# Patient Record
Sex: Female | Born: 1955
Health system: Southern US, Community
[De-identification: ages and names within clinical notes are randomized; demographics above are authoritative.]

## PROBLEM LIST (undated history)

## (undated) DIAGNOSIS — M1990 Unspecified osteoarthritis, unspecified site: Secondary | ICD-10-CM

## (undated) DIAGNOSIS — T7840XA Allergy, unspecified, initial encounter: Secondary | ICD-10-CM

## (undated) HISTORY — PX: TUBAL LIGATION: SHX77

## (undated) HISTORY — DX: Unspecified osteoarthritis, unspecified site: M19.90

## (undated) HISTORY — DX: Allergy, unspecified, initial encounter: T78.40XA

---

## 2001-01-25 ENCOUNTER — Other Ambulatory Visit: Admission: RE | Admit: 2001-01-25 | Discharge: 2001-01-25 | Payer: Self-pay | Admitting: Gynecology

## 2003-08-06 ENCOUNTER — Emergency Department (HOSPITAL_COMMUNITY): Admission: EM | Admit: 2003-08-06 | Discharge: 2003-08-06 | Payer: Self-pay | Admitting: Family Medicine

## 2004-07-01 ENCOUNTER — Ambulatory Visit: Payer: Self-pay | Admitting: Internal Medicine

## 2004-07-01 ENCOUNTER — Ambulatory Visit: Payer: Self-pay | Admitting: *Deleted

## 2004-09-17 ENCOUNTER — Ambulatory Visit: Payer: Self-pay | Admitting: Internal Medicine

## 2006-01-12 ENCOUNTER — Ambulatory Visit: Payer: Self-pay | Admitting: Family Medicine

## 2006-01-13 ENCOUNTER — Ambulatory Visit: Payer: Self-pay | Admitting: *Deleted

## 2006-09-08 ENCOUNTER — Ambulatory Visit: Payer: Self-pay | Admitting: Family Medicine

## 2006-11-25 ENCOUNTER — Encounter (INDEPENDENT_AMBULATORY_CARE_PROVIDER_SITE_OTHER): Payer: Self-pay | Admitting: *Deleted

## 2007-02-02 ENCOUNTER — Ambulatory Visit: Payer: Self-pay | Admitting: Family Medicine

## 2007-02-02 LAB — CONVERTED CEMR LAB
HCV Ab: NEGATIVE
Hep B E Ab: NEGATIVE
Hep B S Ab: NEGATIVE

## 2007-05-31 ENCOUNTER — Ambulatory Visit (HOSPITAL_COMMUNITY): Admission: RE | Admit: 2007-05-31 | Discharge: 2007-05-31 | Payer: Self-pay | Admitting: Family Medicine

## 2007-06-15 ENCOUNTER — Encounter (INDEPENDENT_AMBULATORY_CARE_PROVIDER_SITE_OTHER): Payer: Self-pay | Admitting: Family Medicine

## 2007-06-15 ENCOUNTER — Ambulatory Visit: Payer: Self-pay | Admitting: Family Medicine

## 2007-06-15 ENCOUNTER — Other Ambulatory Visit: Admission: RE | Admit: 2007-06-15 | Discharge: 2007-06-15 | Payer: Self-pay | Admitting: Family Medicine

## 2007-06-21 ENCOUNTER — Ambulatory Visit (HOSPITAL_COMMUNITY): Admission: RE | Admit: 2007-06-21 | Discharge: 2007-06-21 | Payer: Self-pay | Admitting: Family Medicine

## 2007-08-16 ENCOUNTER — Ambulatory Visit: Payer: Self-pay | Admitting: Family Medicine

## 2007-08-16 LAB — CONVERTED CEMR LAB
BUN: 19 mg/dL (ref 6–23)
CO2: 24 meq/L (ref 19–32)
Calcium: 9.2 mg/dL (ref 8.4–10.5)
Chloride: 105 meq/L (ref 96–112)
Cholesterol: 185 mg/dL (ref 0–200)
Creatinine, Ser: 0.66 mg/dL (ref 0.40–1.20)
HDL: 51 mg/dL (ref >39)
Magnesium: 2.1 mg/dL (ref 1.5–2.5)
Potassium: 4.5 meq/L (ref 3.5–5.3)
Sodium: 139 meq/L (ref 135–145)
Total CHOL/HDL Ratio: 3.6
VLDL: 18 mg/dL (ref 0–40)
Vit D, 1,25-Dihydroxy: 22 — ABNORMAL LOW (ref 30–89)

## 2008-02-20 ENCOUNTER — Ambulatory Visit: Payer: Self-pay | Admitting: Internal Medicine

## 2008-02-21 ENCOUNTER — Encounter: Payer: Self-pay | Admitting: Internal Medicine

## 2008-02-21 ENCOUNTER — Inpatient Hospital Stay (HOSPITAL_COMMUNITY): Admission: EM | Admit: 2008-02-21 | Discharge: 2008-02-22 | Payer: Self-pay | Admitting: Emergency Medicine

## 2008-03-01 ENCOUNTER — Ambulatory Visit: Payer: Self-pay | Admitting: Internal Medicine

## 2008-03-21 ENCOUNTER — Ambulatory Visit: Payer: Self-pay | Admitting: Family Medicine

## 2008-05-03 ENCOUNTER — Ambulatory Visit: Payer: Self-pay | Admitting: Family Medicine

## 2008-05-03 ENCOUNTER — Encounter (INDEPENDENT_AMBULATORY_CARE_PROVIDER_SITE_OTHER): Payer: Self-pay | Admitting: Adult Health

## 2008-05-03 LAB — CONVERTED CEMR LAB
AST: 22 units/L (ref 0–37)
Albumin: 4.1 g/dL (ref 3.5–5.2)
Basophils Absolute: 0 10*3/uL (ref 0.0–0.1)
Calcium: 9.1 mg/dL (ref 8.4–10.5)
Creatinine, Ser: 0.7 mg/dL (ref 0.40–1.20)
Eosinophils Absolute: 0.1 10*3/uL (ref 0.0–0.7)
Glucose, Bld: 95 mg/dL (ref 70–99)
HCT: 38.6 % (ref 36.0–46.0)
Hemoglobin: 13 g/dL (ref 12.0–15.0)
LDL Cholesterol: 109 mg/dL — ABNORMAL HIGH (ref 0–99)
LH: 41.7 milliintl units/mL
Monocytes Relative: 5 % (ref 3–12)
Platelets: 261 10*3/uL (ref 150–400)
Potassium: 4.3 meq/L (ref 3.5–5.3)
Rhuematoid fact SerPl-aCnc: 108 intl units/mL — ABNORMAL HIGH (ref 0–20)
Sodium: 143 meq/L (ref 135–145)
TSH: 1.1 microintl units/mL (ref 0.350–4.50)
Total Bilirubin: 0.4 mg/dL (ref 0.3–1.2)
Total CHOL/HDL Ratio: 3.7
Total Protein: 7.5 g/dL (ref 6.0–8.3)
Triglycerides: 125 mg/dL (ref <150)
VLDL: 25 mg/dL (ref 0–40)

## 2008-06-14 ENCOUNTER — Ambulatory Visit (HOSPITAL_COMMUNITY): Admission: RE | Admit: 2008-06-14 | Discharge: 2008-06-14 | Payer: Self-pay | Admitting: Internal Medicine

## 2008-06-27 ENCOUNTER — Ambulatory Visit: Payer: Self-pay | Admitting: Family Medicine

## 2008-08-14 ENCOUNTER — Ambulatory Visit: Payer: Self-pay | Admitting: Family Medicine

## 2008-09-26 ENCOUNTER — Ambulatory Visit: Payer: Self-pay | Admitting: Family Medicine

## 2008-10-30 ENCOUNTER — Ambulatory Visit: Payer: Self-pay | Admitting: Family Medicine

## 2008-10-30 LAB — CONVERTED CEMR LAB
ALT: 61 units/L — ABNORMAL HIGH (ref 0–35)
AST: 29 units/L (ref 0–37)
Albumin: 4 g/dL (ref 3.5–5.2)
Alkaline Phosphatase: 98 units/L (ref 39–117)
BUN: 14 mg/dL (ref 6–23)
Basophils Relative: 0 % (ref 0–1)
Calcium: 9.1 mg/dL (ref 8.4–10.5)
Creatinine, Ser: 0.76 mg/dL (ref 0.40–1.20)
Eosinophils Relative: 2 % (ref 0–5)
MCHC: 32.2 g/dL (ref 30.0–36.0)
Monocytes Relative: 5 % (ref 3–12)
Sodium: 141 meq/L (ref 135–145)
Total Protein: 7.5 g/dL (ref 6.0–8.3)
WBC: 5.3 10*3/uL (ref 4.0–10.5)

## 2008-12-05 ENCOUNTER — Ambulatory Visit: Payer: Self-pay | Admitting: Family Medicine

## 2008-12-05 LAB — CONVERTED CEMR LAB
Indirect Bilirubin: 0.4 mg/dL (ref 0.0–0.9)
Total Bilirubin: 0.5 mg/dL (ref 0.3–1.2)
Total Protein: 7 g/dL (ref 6.0–8.3)

## 2008-12-26 ENCOUNTER — Ambulatory Visit: Payer: Self-pay | Admitting: Family Medicine

## 2009-03-26 ENCOUNTER — Ambulatory Visit: Payer: Self-pay | Admitting: Family Medicine

## 2009-03-26 LAB — CONVERTED CEMR LAB
ALT: 21 units/L (ref 0–35)
Albumin: 4.1 g/dL (ref 3.5–5.2)
Alkaline Phosphatase: 96 units/L (ref 39–117)
BUN: 21 mg/dL (ref 6–23)
CO2: 26 meq/L (ref 19–32)
Calcium: 9 mg/dL (ref 8.4–10.5)
Creatinine, Ser: 0.69 mg/dL (ref 0.40–1.20)
Eosinophils Absolute: 0.1 10*3/uL (ref 0.0–0.7)
Glucose, Bld: 90 mg/dL (ref 70–99)
Lymphocytes Relative: 35 % (ref 12–46)
RBC: 4.33 M/uL (ref 3.87–5.11)
RDW: 13.2 % (ref 11.5–15.5)
Rhuematoid fact SerPl-aCnc: 99 intl units/mL — ABNORMAL HIGH (ref 0–20)
Sodium: 140 meq/L (ref 135–145)
Total Protein: 7.4 g/dL (ref 6.0–8.3)
WBC: 5.7 10*3/uL (ref 4.0–10.5)

## 2009-05-01 ENCOUNTER — Ambulatory Visit: Payer: Self-pay | Admitting: Family Medicine

## 2009-06-26 ENCOUNTER — Ambulatory Visit: Payer: Self-pay | Admitting: Family Medicine

## 2009-06-26 LAB — CONVERTED CEMR LAB
AST: 22 units/L (ref 0–37)
Alkaline Phosphatase: 91 units/L (ref 39–117)
Basophils Absolute: 0 10*3/uL (ref 0.0–0.1)
Basophils Relative: 0 % (ref 0–1)
CO2: 26 meq/L (ref 19–32)
Calcium: 9.3 mg/dL (ref 8.4–10.5)
Chloride: 104 meq/L (ref 96–112)
Glucose, Bld: 88 mg/dL (ref 70–99)
HCT: 40.7 % (ref 36.0–46.0)
Lymphs Abs: 1.8 10*3/uL (ref 0.7–4.0)
Monocytes Relative: 7 % (ref 3–12)
Neutrophils Relative %: 50 % (ref 43–77)
Potassium: 4.4 meq/L (ref 3.5–5.3)
RBC: 4.36 M/uL (ref 3.87–5.11)
Rhuematoid fact SerPl-aCnc: 85 intl units/mL — ABNORMAL HIGH (ref 0–20)

## 2009-07-03 ENCOUNTER — Ambulatory Visit (HOSPITAL_COMMUNITY): Admission: RE | Admit: 2009-07-03 | Discharge: 2009-07-03 | Payer: Self-pay | Admitting: Internal Medicine

## 2009-07-16 ENCOUNTER — Emergency Department (HOSPITAL_COMMUNITY): Admission: EM | Admit: 2009-07-16 | Discharge: 2009-07-16 | Payer: Self-pay | Admitting: Emergency Medicine

## 2009-07-31 ENCOUNTER — Ambulatory Visit: Payer: Self-pay | Admitting: Family Medicine

## 2009-10-01 ENCOUNTER — Ambulatory Visit: Payer: Self-pay | Admitting: Family Medicine

## 2009-10-01 LAB — CONVERTED CEMR LAB
CO2: 27 meq/L (ref 19–32)
Calcium: 9.1 mg/dL (ref 8.4–10.5)
Creatinine, Ser: 0.72 mg/dL (ref 0.40–1.20)
Eosinophils Absolute: 0.2 10*3/uL (ref 0.0–0.7)
Eosinophils Relative: 4 % (ref 0–5)
Glucose, Bld: 90 mg/dL (ref 70–99)
HCT: 40.1 % (ref 36.0–46.0)
Lymphocytes Relative: 39 % (ref 12–46)
Lymphs Abs: 2 10*3/uL (ref 0.7–4.0)
MCHC: 32.7 g/dL (ref 30.0–36.0)
Monocytes Absolute: 0.3 10*3/uL (ref 0.1–1.0)
Monocytes Relative: 6 % (ref 3–12)
Neutro Abs: 2.7 10*3/uL (ref 1.7–7.7)
RDW: 13.4 % (ref 11.5–15.5)
Sodium: 140 meq/L (ref 135–145)
Total Bilirubin: 0.4 mg/dL (ref 0.3–1.2)

## 2009-11-06 ENCOUNTER — Ambulatory Visit: Payer: Self-pay | Admitting: Family Medicine

## 2010-01-22 ENCOUNTER — Encounter (INDEPENDENT_AMBULATORY_CARE_PROVIDER_SITE_OTHER): Payer: Self-pay | Admitting: Family Medicine

## 2010-01-22 ENCOUNTER — Ambulatory Visit: Payer: Self-pay | Admitting: Family Medicine

## 2010-01-22 LAB — CONVERTED CEMR LAB
ALT: 31 U/L
AST: 21 U/L
Albumin: 4.2 g/dL
Alkaline Phosphatase: 92 U/L
BUN: 14 mg/dL
Basophils Absolute: 0 K/uL
Basophils Relative: 1 %
CO2: 29 meq/L
Calcium: 9.6 mg/dL
Chloride: 104 meq/L
Creatinine, Ser: 0.51 mg/dL
Eosinophils Absolute: 0.2 K/uL
Eosinophils Relative: 3 %
Glucose, Bld: 89 mg/dL
HCT: 40.3 %
Hemoglobin: 13.1 g/dL
Lymphocytes Relative: 41 %
Lymphs Abs: 2.3 K/uL
MCHC: 32.5 g/dL
MCV: 92.9 fL
Monocytes Absolute: 0.4 K/uL
Monocytes Relative: 7 %
Neutro Abs: 2.8 K/uL
Neutrophils Relative %: 49 %
Platelets: 354 K/uL
Potassium: 4.7 meq/L
RBC: 4.34 M/uL
RDW: 14 %
Rheumatoid fact SerPl-aCnc: 83 [IU]/mL — ABNORMAL HIGH
Sodium: 141 meq/L
Total Bilirubin: 0.4 mg/dL
Total Protein: 7.8 g/dL
WBC: 5.6 10*3/microliter

## 2010-07-23 NOTE — Letter (Signed)
March 01, 2008    Marca Ancona, MD  [address]  [place] [address]   RE:  [name], [name]  MRN:  [id number]  /  DOB:  [birth year]   Dear Freida Busman,   It was a pleasure seeing Veronica Hart at your request regarding  her syncopal episode.   The patient is a 55 year old Hispanic woman who was admitted to hospital  following a syncopal episode.  She had gone to the bathroom, she had  urinated, and then fell off the commode.  She was able to get herself  back on the commode after some number of minutes.  When seen by her son,  she was described as pale.  She denies warmth, diaphoresis, or flushing.  She was able at some point over the next number of minutes to get  herself up into the sofa in the family room.  Her son took her blood  pressure at some point in this series of events; it was about 150.  He  gave her ramipril, and some minutes later, she had passed out again.   She was taken to the hospital and she apparently experienced syncope.  There telemetry showed her heart rate was sinus in the 30s.  I presumed  that the blood pressure was low, although those data are not available.  I have reviewed the dictated notes in e-chart and they are not available  there either.  I should also note though that the emergency room doctor  described nausea and diaphoresis as preceded in the first episode.   She has a history of lightheadedness upon standing and bending.  This  has been somewhat longstanding.   She was put on a blood pressure medication at discharge and this has  been associated with some lightheadedness and she stopped it.  Currently, she is taking no medications at all.  She has no known drug  allergies.   SOCIAL HISTORY:  She is married.  She has 2 children.  She does not use  cigarettes, alcohol, or recreational drugs.  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   REVIEW OF SYSTEMS:  Notable for arthritis in her knee and was otherwise  broadly  negative.  She had no prior surgery.   On examination, her blood pressure 127/81 with a pulse of 81 without  orthostatic change.  Her weight was 139.  She was in no acute distress.  Her HEENT exam demonstrated no icterus or xanthoma.  The neck veins were  flat.  The carotids were brisk and full bilaterally without bruits.  Back was without kyphosis or scoliosis.  Lungs were clear.  Heart sounds  were regular without murmurs or gallops.  The abdomen was soft with  active bowel sounds without midline pulsation or hepatomegaly.  Femoral  pulses were 2+.  Distal pulses were intact.  There is no clubbing,  cyanosis, or edema.  Neurological exam was grossly normal.   Electrocardiogram obtained at the Emergency Center was normal.  Echocardiogram was reported as normal.   IMPRESSION:  1. Likely orthostatic syncope with some history of orthostatic      hypotension.  2. Borderline blood pressure issues with poor tolerance of her      amlodipine.   [name] has likely neurally-mediated syncope potentially  provoked by micturition.  I have advised her just to be aware of the  prodrome and to try to put herself flat in the event that she recognizes  it is coming  on.   We reviewed the data that life-threatening syncope is very small given  her structurally normal heart and normal electrocardiogram.  I was  reassuring.  I asked her if she would like to follow up with [place] and she  said no.  She follows with [facility] and will call if there are  further issues.   Thanks very much for the consultation.    Sincerely,      Duke Salvia, MD, [facility]  Electronically Signed    SCK/MedQ  DD: 03/01/2008  DT: 03/02/2008  Job #: [id number]   CC:    Melvern Banker

## 2010-07-23 NOTE — H&P (Signed)
NAME:  Veronica Hart, Veronica Hart NO.:  [id number]   MEDICAL RECORD NO.:  [id number]          PATIENT TYPE:  EMS   LOCATION:  MAJO                         FACILITY:  [facility]   PHYSICIAN:  Wendi Snipes, MD DATE OF BIRTH:  11-17-1955   DATE OF ADMISSION:  02/21/2008  DATE OF DISCHARGE:                              HISTORY & PHYSICAL   PRIMARY CARE PHYSICIAN:  [facility] here in [place].   CHIEF COMPLAINT:  Syncope.   HISTORY OF PRESENT ILLNESS:  This is a 55 year old Spanish-speaking  female here after passing out while in the restroom.  This has never  happened before, and she states that she has been feeling fine and  spontaneously felt weak and felt a darkness come over her and passed  out.  This occurred an additional time while standing, and she fell and  hit her head.  She otherwise denies chest pain, palpitations, nausea,  abdominal pain fevers increased lower extremity edema, paroxysmal  nocturnal dyspnea.  In the ED, she experienced an episode of syncope  while in the stretcher and telemetry showed her heart rate was sinus and  in the 30s at the time.  This resolved spontaneously and lasted for  moments.   PAST MEDICAL HISTORY:  Hypertension.   ALLERGIES:  NO KNOWN DRUG ALLERGIES.   MEDICATIONS:  Altace 5 mg daily.   SOCIAL HISTORY:  She lives in [place] with her husband and her son.  She works at [employer].  She does not smoke.   FAMILY HISTORY:  Noncontributory.  No early coronary disease   REVIEW OF SYSTEMS:  All 14-systems were reviewed were negative except as  mentioned in detail in the HPI.   PHYSICAL EXAMINATION:  VITAL SIGNS:  Blood pressure is 126/57,  respiratory rate 16, pulse 73.  She is sating 100% on 2 liters nasal  cannula.  GENERAL:  She is a 55 year old Latin female appearing stated age.  No  acute distress.  HEENT:  Moist mucous membranes.  Pupils equal, round, react to light  accommodation.  Anicteric sclera.  NECK:  No  jugular venous distention.  No thyromegaly.  CARDIOVASCULAR:  Regular light and rhythm.  No murmurs, rubs or gallops.  LUNGS:  Clear to auscultation bilaterally.  ABDOMEN:  Nontender, nondistended.  Positive bowel sounds.  No masses.  EXTREMITIES:  No clubbing, cyanosis, edema, 2+ pulses throughout.  NEUROLOGICAL:  Alert and oriented x3.  Cranial nerves II-XII grossly  intact.  No focal neurologic deficits.  SKIN:  Warm, dry and intact.  No rashes.  Chest x-ray showed vascular  congestion with atelectasis.  Mild EKG.  Normal sinus rhythm at 66 beats  per minute with early precordial transition.  No ST or T-wave  abnormalities.   LABORATORY REVIEW:  White blood cell count 8.1, hematocrit is 37,  platelets 264.  BUN and Creatinine is 25 and 0.9, respectfully.  Initial  troponin was negative.  D-dimer 0.22.   ASSESSMENT:  A 55 year old Spanish-speaking female here with syncope and  witnessed syncopal event associated with brady arrhythmia.   Syncope/bradycardia.  This is very likely neurocardiogenic in etiology.  Will check a transthoracic  echocardiogram and TSH urinalysis, BNP and  additionally orthostatic blood pressures.  onsider a tilt table test.  She may not benefit from permanent pacemaker if she lacks conduction  system disease, structural heart disease or recurrent symptoms.  Will  continue to monitor her on telemetry and she may need further outpatient  monitoring after discharge seeing this patient.      Wendi Snipes, MD  Electronically Signed     BHH/MEDQ  D:  02/21/2008  T:  02/21/2008  Job:  [id number]

## 2010-07-23 NOTE — Discharge Summary (Signed)
Veronica Hart, Veronica Hart         ACCOUNT NO.:  [id number]   MEDICAL RECORD NO.:  [id number]          PATIENT TYPE:  INP   LOCATION:  3730                         FACILITY:  [facility]   PHYSICIAN:  Rollene Rotunda, MD, FACCDATE OF BIRTH:  06-01-1955   DATE OF ADMISSION:  02/21/2008  DATE OF DISCHARGE:  02/22/2008                               DISCHARGE SUMMARY   PRIMARY CARDIOLOGIST:  The patient does not have a primary cardiologist  and primary MD as listed in health service.   DISCHARGE DIAGNOSIS:  Neurocardiogenic syncope.   SECONDARY DIAGNOSIS:  Hypertension.   ALLERGIES:  The patient has no known drug allergies.   PROCEDURES PERFORMED DURING THIS HOSPITALIZATION:  1. On February 21, 2008, the patient had a chest x-ray performed,      which showed low volume chest film with vascular crowding and      atelectasis.  It showed mild central vascular congestion without      overt pulmonary edema.  2. On February 21, 2008, the patient had a electrocardiogram      performed.  It showed normal sinus rhythm with a rate of 72.  No      significant ST changes.  No significant Q waves.  Normal axis.  3. On February 22, 2008, the patient had an electrocardiogram with no      significant changes from the previous one performed on February 21, 2008.  4. On February 21, 2008, the patient had an echocardiogram that showed      LV systolic function was vigorous with an LVEF between 65 and 75%.   BRIEF HISTORY OF PRESENT ILLNESS:  The patient is a 55 year old Spanish-  speaking female with a history of hypertension who presents after  passing out while in the restroom.  This occurred one additional time  while standing.  She fell and hit her head.  This has never happened  before.  She describes lightheadedness and darkness prior to loss of  consciousness. She passed out while in the ED with tele showing a heart  rate in the 30s.   HOSPITAL COURSE:  The patient was admitted and ruled out  for MI, and for  significant anatomical abnormalities on echo.  The patient will be  followed up on an outpatient basis with a one month event monitor as  well as tilt table test.  Prior to discharge, the patient ambulated  without any symptoms.  Vital signs remained stable during ambulation.  Vital signs on the day of discharge were temperature 97.6, BP 107/59,  heart rate 73, respirations 19, and O2 saturations 97% on room air.  Her  weight was 62.8 kg.   DISCHARGE LABORATORY DATA:  WBC 8.7, HGB 12.9, HCT 37.8, and PLT count  238.  Sodium 140, potassium 4.0, chloride 107, CO2 26, BUN 17,  creatinine 0.72, glucose 98, and calcium 9.0.  Last set of cardiac  markers negative.  CK 70, CK-MB 1.0, and troponin I less than 0.01.  Urinalysis color yellow, appearance clear, SG 1.014, and pH 7.5.  Urine  glucose negative, bilirubin negative, ketones negative, blood negative,  protein  negative, urobilinogen 0.2, nitrite negative, leukocyte trace,  squamous epithelial cells rare, wbc/HPF 0-2, rbc/HPF 0-2, and bacteria  rare.  No outstanding labs at this time.   DISCHARGE MEDICATION:  Ramipril 5 mg by mouth daily.   Duration of discharge encountered including physician time was 45  minutes.      Jarrett Ables, [facility]      Rollene Rotunda, MD, [facility]  Electronically Signed    MS/MEDQ  D:  02/22/2008  T:  02/23/2008  Job:  [id number]   cc:   Dr. Shirlee Latch

## 2010-07-25 ENCOUNTER — Other Ambulatory Visit (HOSPITAL_COMMUNITY): Payer: Self-pay | Admitting: Family Medicine

## 2010-07-25 DIAGNOSIS — Z1231 Encounter for screening mammogram for malignant neoplasm of breast: Secondary | ICD-10-CM

## 2010-07-29 ENCOUNTER — Ambulatory Visit (HOSPITAL_COMMUNITY)
Admission: RE | Admit: 2010-07-29 | Discharge: 2010-07-29 | Disposition: A | Discharge status: Home / Self Care | Payer: Self-pay | Source: Ambulatory Visit | Attending: Family Medicine | Admitting: Family Medicine

## 2010-07-29 DIAGNOSIS — Z1231 Encounter for screening mammogram for malignant neoplasm of breast: Secondary | ICD-10-CM

## 2010-12-13 LAB — MAGNESIUM: Magnesium: 2.3 mg/dL (ref 1.5–2.5)

## 2010-12-13 LAB — COMPREHENSIVE METABOLIC PANEL
AST: 20 U/L (ref 0–37)
Albumin: 3.5 g/dL (ref 3.5–5.2)
BUN: 19 mg/dL (ref 6–23)
CO2: 24 mEq/L (ref 19–32)
Creatinine, Ser: 0.63 mg/dL (ref 0.4–1.2)
GFR calc Af Amer: >60 mL/min (ref >60)
Glucose, Bld: 115 mg/dL — ABNORMAL HIGH (ref 70–99)
Sodium: 136 mEq/L (ref 135–145)

## 2010-12-13 LAB — D-DIMER, QUANTITATIVE: D-Dimer, Quant: 0.22 ug/mL-FEU (ref 0.00–0.48)

## 2010-12-13 LAB — CBC
HCT: 37.8 % (ref 36.0–46.0)
Hemoglobin: 12.5 g/dL (ref 12.0–15.0)
MCHC: 35.1 g/dL (ref 30.0–36.0)
MCV: 91.4 fL (ref 78.0–100.0)
Platelets: 238 10*3/uL (ref 150–400)
Platelets: 264 10*3/uL (ref 150–400)
RBC: 4.05 MIL/uL (ref 3.87–5.11)
WBC: 8.7 10*3/uL (ref 4.0–10.5)

## 2010-12-13 LAB — BASIC METABOLIC PANEL
BUN: 17 mg/dL (ref 6–23)
Chloride: 107 mEq/L (ref 96–112)
GFR calc non Af Amer: >60 mL/min (ref >60)
Sodium: 140 mEq/L (ref 135–145)

## 2010-12-13 LAB — HEMOGLOBIN A1C: Hgb A1c MFr Bld: 6 % (ref 4.6–6.1)

## 2010-12-13 LAB — POCT I-STAT, CHEM 8
BUN: 23 mg/dL (ref 6–23)
Calcium, Ion: 1.16 mmol/L (ref 1.12–1.32)
Glucose, Bld: 132 mg/dL — ABNORMAL HIGH (ref 70–99)

## 2010-12-13 LAB — CARDIAC PANEL(CRET KIN+CKTOT+MB+TROPI)
CK, MB: 1 ng/mL (ref 0.3–4.0)
Relative Index: INVALID (ref 0.0–2.5)
Troponin I: <0.01 ng/mL (ref 0.00–0.06)

## 2010-12-13 LAB — DIFFERENTIAL
Basophils Relative: 0 % (ref 0–1)
Lymphocytes Relative: 19 % (ref 12–46)
Lymphs Abs: 1.5 10*3/uL (ref 0.7–4.0)
Monocytes Relative: 4 % (ref 3–12)
Neutrophils Relative %: 76 % (ref 43–77)

## 2010-12-13 LAB — URINALYSIS, ROUTINE W REFLEX MICROSCOPIC
Bilirubin Urine: NEGATIVE
Hgb urine dipstick: NEGATIVE
Ketones, ur: NEGATIVE mg/dL
Nitrite: NEGATIVE
Urobilinogen, UA: 0.2 mg/dL (ref 0.0–1.0)

## 2010-12-13 LAB — POCT CARDIAC MARKERS
CKMB, poc: <1 ng/mL — ABNORMAL LOW (ref 1.0–8.0)
Myoglobin, poc: 25 ng/mL (ref 12–200)
Troponin i, poc: <0.05 ng/mL (ref 0.00–0.09)

## 2010-12-13 LAB — B-NATRIURETIC PEPTIDE (CONVERTED LAB): Pro B Natriuretic peptide (BNP): <30 pg/mL (ref 0.0–100.0)

## 2010-12-13 LAB — CREATININE, SERUM: GFR calc Af Amer: >60 mL/min (ref >60)

## 2010-12-13 LAB — TSH: TSH: 0.65 u[IU]/mL (ref 0.350–4.500)

## 2010-12-13 LAB — APTT: aPTT: 24 seconds (ref 24–37)

## 2010-12-13 LAB — PROTIME-INR
INR: 0.9 (ref 0.00–1.49)
Prothrombin Time: 12.4 seconds (ref 11.6–15.2)

## 2010-12-13 LAB — URINE MICROSCOPIC-ADD ON

## 2010-12-13 LAB — GLUCOSE, CAPILLARY: Glucose-Capillary: 140 mg/dL — ABNORMAL HIGH (ref 70–99)

## 2012-12-07 ENCOUNTER — Ambulatory Visit: Payer: Self-pay | Attending: Internal Medicine

## 2013-01-11 ENCOUNTER — Ambulatory Visit: Payer: Self-pay | Attending: Internal Medicine | Admitting: Internal Medicine

## 2013-01-11 VITALS — BP 140/84 | HR 76 | Temp 98.0°F | Resp 16 | Wt 138.4 lb

## 2013-01-11 DIAGNOSIS — H9202 Otalgia, left ear: Secondary | ICD-10-CM

## 2013-01-11 DIAGNOSIS — M1990 Unspecified osteoarthritis, unspecified site: Secondary | ICD-10-CM

## 2013-01-11 DIAGNOSIS — R51 Headache: Secondary | ICD-10-CM

## 2013-01-11 MED ORDER — IBUPROFEN 400 MG PO TABS: 800.0000 mg | ORAL_TABLET | Freq: Three times a day (TID) | ORAL | Status: DC | PRN

## 2013-01-11 NOTE — Progress Notes (Unsigned)
Patient Demographics  Veronica Hart, is a 57 y.o. female  JYN:[id number]  QMV:[id number]  DOB - April 28, 1955  Chief Complaint  Patient presents with  . Headache  . Sore Throat        Subjective:   Veronica Hart today is here to establish primary care. Patient has no significant past medical history presents here for establishment of routine primary care. She claims that for the past 3 months she has been having intermittent left ear pain. She also claims that her left throat hurts intermittently as well. She denies any hoarseness of voice, cough, difficulty swallowing. She denies any cough or fever as well. She also gives a history of having bilateral knee pain. She also gives a history of having intermittent frontal headaches-especially when she reads, is not wearing her glasses-not sure if these are prescription glasses. No history of phonophobia or photophobia.  Currently patient has no other complaints. Patient has also has No headache, No chest pain, No abdominal pain,No Nausea, No new weakness tingling or numbness, No Cough or SOB.   Objective:    Filed Vitals:   01/11/13 1634  BP: 140/84  Pulse: 76  Temp: 98 F (36.7 C)  Resp: 16  Weight: 138 lb 6.4 oz (62.778 kg)  SpO2: 100%     ALLERGIES:  No Known Allergies  PAST MEDICAL HISTORY: History reviewed. No pertinent past medical history.  PAST SURGICAL HISTORY: Bilateral tubal ligation  FAMILY HISTORY: No family history of coronary disease  MEDICATIONS AT HOME: Prior to Admission medications   Medication Sig Start Date End Date Taking? Authorizing Provider  LORATADINE PO Take by mouth.   Yes Historical Provider, MD  ibuprofen (ADVIL,MOTRIN) 400 MG tablet Take 2 tablets (800 mg total) by mouth every 8 (eight) hours as needed for mild pain. 01/11/13   Maretta Bees, MD    SOCIAL HISTORY:   has no tobacco, alcohol, and drug history on file.  REVIEW OF SYSTEMS:  Constitutional:   No    Fevers, chills, fatigue.  HEENT:    No , Sore throat,   Cardio-vascular: No chest pain,  Orthopnea, swelling in lower extremities, anasarca, palpitations  GI:  No abdominal pain, nausea, vomiting, diarrhea  Resp: No shortness of breath,  No coughing up of blood.No cough.No wheezing.  Skin:  no rash or lesions.  GU:  no dysuria, change in color of urine, no urgency or frequency.  No flank pain.  Musculoskeletal: No joint pain or swelling.  No decreased range of motion.  No back pain.  Psych: No change in mood or affect. No depression or anxiety.  No memory loss.   Exam  General appearance :Awake, alert, not in any distress. Speech Clear. Not toxic Looking HEENT: Atraumatic and Normocephalic, pupils equally reactive to light and accomodation. Examination of the left ear auditory canal shows some whitish deposits in the tympanic membrane Neck: supple, no JVD. No cervical lymphadenopathy.  Chest:Good air entry bilaterally, no added sounds  CVS: S1 S2 regular, no murmurs.  Abdomen: Bowel sounds present, Non tender and not distended with no gaurding, rigidity or rebound. Extremities: B/L Lower Ext shows no edema, both legs are warm to touch Neurology: Awake alert, and oriented X 3, CN II-XII intact, Non focal Skin:No Rash Wounds:N/A    Data Review   CBC No results found for this basename: WBC, HGB, HCT, PLT, MCV, MCH, MCHC, RDW, NEUTRABS, LYMPHSABS, MONOABS, EOSABS, BASOSABS, BANDABS, BANDSABD,  in the last 168 hours  Chemistries   No results  found for this basename: NA, K, CL, CO2, GLUCOSE, BUN, CREATININE, GFRCGP, CALCIUM, MG, AST, ALT, ALKPHOS, BILITOT,  in the last 168 hours ------------------------------------------------------------------------------------------------------------------ No results found for this basename: HGBA1C,  in the last 72  hours ------------------------------------------------------------------------------------------------------------------ No results found for this basename: CHOL, HDL, LDLCALC, TRIG, CHOLHDL, LDLDIRECT,  in the last 72 hours ------------------------------------------------------------------------------------------------------------------ No results found for this basename: TSH, T4TOTAL, FREET3, T3FREE, THYROIDAB,  in the last 72 hours ------------------------------------------------------------------------------------------------------------------ No results found for this basename: VITAMINB12, FOLATE, FERRITIN, TIBC, IRON, RETICCTPCT,  in the last 72 hours  Coagulation profile  No results found for this basename: INR, PROTIME,  in the last 168 hours    Assessment & Plan   Left ear pain - Intermittent, some whitish deposits noted tympanic membrane? Significance - Will refer to ENT  Intermittent headache - This is a chronic history for the patient - Mostly in the frontal area. No phonophobia photophobia. Trial of nonsteroidal anti-inflammatory medications. If persists, will consider further workup. For now monitor and follow clinical course  Bilateral knee pain - Spread osteoarthritis - Check x-ray of the knees  We'll check some routine baseline labs including CBC, CMET, TSH, A1c- please follow at next visit   Health Maintenance -Colonoscopy: Will refer to GI -Pap Smear: Will refer to GYN -Mammogram: Will order -Vaccinations:  - Influenza-will order today  Follow up in one month  The patient was given clear instructions to go to ER or return to medical center if symptoms don't improve, worsen or new problems develop. The patient verbalized understanding. The patient was told to call to get lab results if they haven't heard anything in the next week.

## 2013-01-11 NOTE — Progress Notes (Unsigned)
Patient here with interpreter Complains of sore throat and headache past two weeks Has arthritis and having pain to her knees and hands

## 2013-01-12 ENCOUNTER — Other Ambulatory Visit: Payer: Self-pay | Admitting: Internal Medicine

## 2013-01-12 DIAGNOSIS — Z1231 Encounter for screening mammogram for malignant neoplasm of breast: Secondary | ICD-10-CM

## 2013-01-17 ENCOUNTER — Ambulatory Visit: Payer: No Typology Code available for payment source | Attending: Internal Medicine

## 2013-01-17 DIAGNOSIS — H9202 Otalgia, left ear: Secondary | ICD-10-CM

## 2013-01-17 DIAGNOSIS — H9209 Otalgia, unspecified ear: Secondary | ICD-10-CM | POA: Insufficient documentation

## 2013-01-17 LAB — CBC
HCT: 37.5 % (ref 36.0–46.0)
Hemoglobin: 12.7 g/dL (ref 12.0–15.0)
MCHC: 33.9 g/dL (ref 30.0–36.0)
WBC: 4.7 10*3/uL (ref 4.0–10.5)

## 2013-01-18 LAB — COMPLETE METABOLIC PANEL WITH GFR
CO2: 27 mEq/L (ref 19–32)
Creat: 0.67 mg/dL (ref 0.50–1.10)
GFR, Est African American: >89 mL/min
GFR, Est Non African American: >89 mL/min
Glucose, Bld: 90 mg/dL (ref 70–99)
Total Bilirubin: 0.3 mg/dL (ref 0.3–1.2)

## 2013-01-18 LAB — LIPID PANEL
HDL: 44 mg/dL (ref >39)
LDL Cholesterol: 103 mg/dL — ABNORMAL HIGH (ref 0–99)
Triglycerides: 100 mg/dL (ref <150)
VLDL: 20 mg/dL (ref 0–40)

## 2013-01-19 ENCOUNTER — Encounter: Payer: Self-pay | Admitting: Nurse Practitioner

## 2013-01-19 ENCOUNTER — Telehealth: Payer: Self-pay | Admitting: Emergency Medicine

## 2013-01-19 NOTE — Telephone Encounter (Signed)
Message copied by Darlis Loan on Wed Jan 19, 2013  1:40 PM ------      Message from: Maretta Bees      Created: Wed Jan 19, 2013 11:24 AM       Please let patient know her A1C is slightly high-to begin diet and exercise, she will need to keep her next appointment and will discuss treatment options then. ------

## 2013-01-19 NOTE — Progress Notes (Signed)
Pt given lab results with instructions via pacific interpretor phone. Verbalized understanding.

## 2013-01-19 NOTE — Progress Notes (Signed)
Quick Note:  Please let patient know her A1C is slightly high-to begin diet and exercise, she will need to keep her next appointment and will discuss treatment options then. ______

## 2013-02-14 ENCOUNTER — Other Ambulatory Visit: Payer: Self-pay | Admitting: Internal Medicine

## 2013-02-14 ENCOUNTER — Ambulatory Visit: Payer: No Typology Code available for payment source | Attending: Internal Medicine | Admitting: Internal Medicine

## 2013-02-14 ENCOUNTER — Encounter: Payer: Self-pay | Admitting: Internal Medicine

## 2013-02-14 ENCOUNTER — Ambulatory Visit (HOSPITAL_COMMUNITY)
Admission: RE | Admit: 2013-02-14 | Discharge: 2013-02-14 | Disposition: A | Discharge status: Home / Self Care | Payer: No Typology Code available for payment source | Source: Ambulatory Visit | Attending: Internal Medicine | Admitting: Internal Medicine

## 2013-02-14 VITALS — BP 156/91 | HR 71 | Temp 98.2°F | Resp 16 | Ht <= 58 in | Wt 139.0 lb

## 2013-02-14 DIAGNOSIS — M1990 Unspecified osteoarthritis, unspecified site: Secondary | ICD-10-CM | POA: Insufficient documentation

## 2013-02-14 DIAGNOSIS — Z1231 Encounter for screening mammogram for malignant neoplasm of breast: Secondary | ICD-10-CM

## 2013-02-14 DIAGNOSIS — H9202 Otalgia, left ear: Secondary | ICD-10-CM

## 2013-02-14 DIAGNOSIS — M129 Arthropathy, unspecified: Secondary | ICD-10-CM

## 2013-02-14 MED ORDER — NAPROXEN 500 MG PO TABS: 500.0000 mg | ORAL_TABLET | Freq: Two times a day (BID) | ORAL | Status: DC

## 2013-02-14 NOTE — Progress Notes (Signed)
Patient ID: Veronica Hart, female   DOB: 31-Jul-1955, 57 y.o.   MRN: [id number] Patient Demographics  Veronica Hart, is a 57 y.o. female  WUJ:[id number]  [id number]  DOB - 07-Jun-1955  Chief Complaint  Patient presents with  . Follow-up        Subjective:   Veronica Hart is a 57 y.o. female here today for a follow up visit. Patient is here mainly to discuss the results of the previous laboratory tests. She also wants a pain medication For her osteoarthritis. She has no new complaints. She does not smoke cigarette, she does not drink alcohol Patient has No headache, No chest pain, No abdominal pain - No Nausea, No new weakness tingling or numbness, No Cough - SOB.  ALLERGIES: No Known Allergies  PAST MEDICAL HISTORY: History reviewed. No pertinent past medical history.  MEDICATIONS AT HOME: Prior to Admission medications   Medication Sig Start Date End Date Taking? Authorizing Provider  ibuprofen (ADVIL,MOTRIN) 400 MG tablet Take 2 tablets (800 mg total) by mouth every 8 (eight) hours as needed for mild pain. 01/11/13   Shanker Levora Dredge, MD  LORATADINE PO Take by mouth.    Historical Provider, MD  naproxen (NAPROSYN) 500 MG tablet Take 1 tablet (500 mg total) by mouth 2 (two) times daily with a meal. 02/14/13   Jeanann Lewandowsky, MD     Objective:   Filed Vitals:   02/14/13 1206  BP: 156/91  Pulse: 71  Temp: 98.2 F (36.8 C)  TempSrc: Oral  Resp: 16  Height: 4\' 10"  (1.473 m)  Weight: 139 lb (63.05 kg)  SpO2: 97%    Exam General appearance : Awake, alert, not in any distress. Speech Clear. Not toxic looking HEENT: Atraumatic and Normocephalic, pupils equally reactive to light and accomodation Neck: supple, no JVD. No cervical lymphadenopathy.  Chest:Good air entry bilaterally, no added sounds  CVS: S1 S2 regular, no murmurs.  Abdomen: Bowel sounds present, Non tender and not distended with no gaurding, rigidity or rebound. Extremities: B/L Lower  Ext shows no edema, both legs are warm to touch Neurology: Awake alert, and oriented X 3, CN II-XII intact, Non focal Skin:No Rash Wounds:N/A   Data Review   CBC No results found for this basename: WBC, HGB, HCT, PLT, MCV, MCH, MCHC, RDW, NEUTRABS, LYMPHSABS, MONOABS, EOSABS, BASOSABS, BANDABS, BANDSABD,  in the last 168 hours  Chemistries   No results found for this basename: NA, K, CL, CO2, GLUCOSE, BUN, CREATININE, GFRCGP, CALCIUM, MG, AST, ALT, ALKPHOS, BILITOT,  in the last 168 hours ------------------------------------------------------------------------------------------------------------------ No results found for this basename: HGBA1C,  in the last 72 hours ------------------------------------------------------------------------------------------------------------------ No results found for this basename: CHOL, HDL, LDLCALC, TRIG, CHOLHDL, LDLDIRECT,  in the last 72 hours ------------------------------------------------------------------------------------------------------------------ No results found for this basename: TSH, T4TOTAL, FREET3, T3FREE, THYROIDAB,  in the last 72 hours ------------------------------------------------------------------------------------------------------------------ No results found for this basename: VITAMINB12, FOLATE, FERRITIN, TIBC, IRON, RETICCTPCT,  in the last 72 hours  Coagulation profile  No results found for this basename: INR, PROTIME,  in the last 168 hours    Assessment & Plan   Her laboratory tests results have been discussed, normal  1. Arthritis  - naproxen (NAPROSYN) 500 MG tablet; Take 1 tablet (500 mg total) by mouth 2 (two) times daily with a meal.  Dispense: 60 tablet; Refill: 1 Patient counseled extensively about nutrition and exercise  Follow up in 3 months or when necessary  Interpreter was used to communicate directly with patient for the entire  encounter including providing detailed patient instructions.   The  patient was given clear instructions to go to ER or return to medical center if symptoms don't improve, worsen or new problems develop. The patient verbalized understanding. The patient was told to call to get lab results if they haven't heard anything in the next week.    Jeanann Lewandowsky, MD, MHA, FACP, FAAP [facility] [place], [place] [phone]   02/14/2013, 12:21 PM

## 2013-02-14 NOTE — Patient Instructions (Signed)
Artritis inespecífica  (Arthritis, Nonspecific)  La artritis es la inflamación de una articulación. Los síntomas son dolor, enrojecimiento, calor o hinchazón. Pueden verse involucradas una o más articulaciones. Hay diferentes tipos de artritis. El médico no podrá diagnosticar inmediatamente cuál es el tipo de artritis que usted sufre.   CAUSAS  La causa más frecuente es el desgaste de la articulación (osteoartritis). Esto ocasiona lesiones en el cartílago, que puede romperse con el tiempo. Las zonas más afectadas por este tipo de artritis son las rodillas, caderas, espalda y cuello.  Otros tipos de artritis y causas frecuentes de dolor en la articulación son:  · Esguinces y otras lesiones cercanas a la articulación}. En algunos casos, esguinces y lesiones menores causan dolor e hinchazón que aparece horas más tarde.  · Artritis reumatoidea Afecta las manos, pies y rodillas. Generalmente afecta ambos lados del cuerpo al mismo tiempo. Generalmente se asocia a enfermedades crónicas, fiebre, pérdida de peso y debilidad general.  · Artritis por cristales. La gota y la pseudogota pueden causar dolor intenso agudo ocasional, enrojecimiento e hinchazón del pie, el tobillo o la rodilla.  · Artritis infecciosa. Las bacterias pueden penetrar en la articulación a través de una herida en la piel. Esto puede causar una infección en la articulación. Las bacterias y virus también pueden diseminarse a través del torrente sanguíneo y afectar las articulaciones.  · Reacciones a medicamentos, infecciosas y alérgicas. En algunos casos las articulaciones duelen levemente y están ligeramente hinchadas en este tipo de enfermedad.  SÍNTOMAS  · El dolor es el síntoma principal.  · La articulación también pueden verse roja, hinchada y caliente al tacto.  · En ciertos tipos de artritis hay fiebre o malestar general.  · En la articulación que presenta artritis sentirá dolor con el movimiento. En otros tipos de artritis hay  rigidez.  DIAGNÓSTICO:  El médico sospechará artritis basándose en la descripción de los síntomas y en el examen. Será necesario realizar pruebas para diagnosticar el tipo de artritis.  · Análisis de sangre y en algunos casos de orina.  · Radiografías y en algunos casos tomografía computada o diagnóstico por imágenes.  · La remoción del líquido de la articulación (artrocentesis) se realiza para controlar la presencia de bacterias, cristales o por otras causas. Su médico (o un especialista) adormecerán la zona de la articulación con un anestésico local y utilizarán una aguja para retirar líquido de la articulación para ser examinado. Este procedimiento es sólo mínimamente molesto.  · Aún con estas pruebas, el médico no podrá decir qué tipo de artritis usted sufre. La consulta con un especialista (reumatólogo) puede ser de utilidad.  TRATAMIENTO  El médico comentará con usted el tratamiento específico para su tipo de artritis. Si el tipo específico no puede determinarse, podrán aplicarse las siguientes recomendaciones generales.   El tratamiento para el dolor intenso de las articulaciones consiste en:  · Hacer reposo  · Elevar el miembro.  · Podrán prescribirle medicamentos antiinflamatorios (como ibuprofeno). Evite las actividades que aumenten el dolor.  · Sólo tome medicamentos de venta libre o prescriptos para calmar el dolor y las molestias, según las indicaciones de su médico.  · Puede aplicarse compresas frías sobre la articulación dolorida durante 10 a 15 minutos cada hora. Las compresas calientes también pueden ser beneficiosas, pero no las utilice durante la noche. No use compresas calientes sin autorización de su médico, si es diabético.  · Una inyección de corticoides en la articulación artrítica puede ayudar a reducir el dolor y la hinchazón.  ·   Si una artritis aguda empeora en los siguientes 1 ó 2 días, será necesario descartar una infección.  El tratamiento prolongado implica la modificación de  actividades y del estilo de vida para reducir el estrés en la articulación. Puede ser necesario que baje de peso. La actividad física es necesaria para nutrir el cartílago de la articulación y eliminar los desechos. Esto ayuda a mantener fuertes los músculos que rodean la articulación.  INSTRUCCIONES PARA EL CUIDADO DOMICILIARIO  · No tome aspirina para aliviar el dolor si se sospecha que sufre gota. Esto eleva los niveles de ácido úrico.  · Solo tome medicamentos que se pueden comprar sin receta o recetados para el dolor, malestar o fiebre, como le indica el médico.  · Haga reposo todo el tiempo que pueda.  · Si la articulación está hinchada, manténgala elevada.  · Utilice muletas si la articulación que le duele está en la pierna.  · Beber abundante cantidad de líquidos será beneficioso para ciertos tipos de artritis.  · Siga las indicaciones del profesional.  · La actividad física regular puede ser beneficiosa, incluyendo las actividades de bajo impacto como:  · Natación.  · Aquagym.  · Andar en bicicleta.  · Caminar.  · La rigidez matutina se alivia con una ducha caliente.  · También es beneficioso que realice ejercicios de amplitud de movimiento.  SOLICITE ATENCIÓN MÉDICA SI:  · No se siente mejor o empeora luego de las 24 horas.  · Presenta efectos adversos por los medicamentos y no mejora con el tratamiento.  SOLICITE ATENCIÓN MÉDICA INMEDIATAMENTE SI:  · Tiene fiebre.  · Presenta fiebre o dolor intenso, hinchazón o enrojecimiento.  · Muchas articulaciones están involucradas y están hinchadas y siente dolor.  · Tiene un dolor intenso en la espalda o siente debilidad en las piernas.  · Pierde el control de la vejiga o del intestino.  Document Released: 02/24/2005 Document Revised: 05/19/2011  [employer]® Patient Information ©2014 [employer].

## 2013-02-14 NOTE — Progress Notes (Signed)
Pt is here following up to review her lab results. She states that she needs medication for her arthritis.

## 2013-02-15 ENCOUNTER — Encounter: Payer: Self-pay | Admitting: Gastroenterology

## 2013-02-21 ENCOUNTER — Ambulatory Visit (INDEPENDENT_AMBULATORY_CARE_PROVIDER_SITE_OTHER): Payer: No Typology Code available for payment source | Admitting: Obstetrics & Gynecology

## 2013-02-21 ENCOUNTER — Encounter: Payer: Self-pay | Admitting: Nurse Practitioner

## 2013-02-21 VITALS — BP 131/79 | HR 72 | Temp 97.8°F | Ht <= 58 in | Wt 135.4 lb

## 2013-02-21 DIAGNOSIS — Z758 Other problems related to medical facilities and other health care: Secondary | ICD-10-CM | POA: Insufficient documentation

## 2013-02-21 DIAGNOSIS — Z603 Acculturation difficulty: Secondary | ICD-10-CM

## 2013-02-21 DIAGNOSIS — Z789 Other specified health status: Secondary | ICD-10-CM | POA: Insufficient documentation

## 2013-02-21 DIAGNOSIS — Z609 Problem related to social environment, unspecified: Secondary | ICD-10-CM

## 2013-02-21 DIAGNOSIS — Z01419 Encounter for gynecological examination (general) (routine) without abnormal findings: Secondary | ICD-10-CM

## 2013-02-21 NOTE — Patient Instructions (Signed)
Cuidados preventivos en las mujeres adultas  (Preventive Care for Adults, Female) Un estilo de vida saludable y los cuidados preventivos pueden favorecer la salud y [place]. Las guas para Engineer, building services salud para las mujeres incluyen las siguientes prcticas clave:   Un examen fsico de rutina anual es un buen modo de Chief Operating Officer su salud y Education officer, environmental estudios preventivos. Le da la posibilidad de Agricultural consultant preocupaciones y Solicitor el estado de su salud, y que le realicen estudios completos.  Consulte al dentista para realizar un examen de rutina y cuidados preventivos cada 6 meses. Cepllese los dientes al [employer] veces por da y psese el hilo dental al menos una vez por da. Una buena higiene bucal evita caries y enfermedades de las encas.  La frecuencia con que debe hacerse exmenes de la vista depende de la edad, el estado de [place], la historia familiar, el uso de lentes de contacto y otros factores. Siga las recomendaciones del mdico para saber con qu frecuencia debe hacerse exmenes de la vista.  Consuma una dieta saludable. Los Sun Microsystems, frutas, granos enteros, productos lcteos descremados y protenas magras contienen los nutrientes que usted necesita sin necesidad de consumir muchas caloras. Disminuya el consumo de alimentos con alto contenido de grasas slidas, azcar y sal agregadas. Consuma la cantidad de caloras adecuada para usted.Si es necesario, pdale informacin acerca de [place] a su mdico.  La actividad fsica regular es una de las cosas ms importantes que puede hacer por su salud. Los adultos deben hacer al menos 150 minutos de ejercicios de intensidad moderada (cualquier actividad que aumente la frecuencia cardaca y lo haga transpirar) cada semana. Adems, la Harley[employer]Davidson de los adultos necesita ejercicios de fortalecimiento muscular 2  ms [employer] Lilly and [employer].  Mantenga un peso saludable. El ndice de masa corporal [facility]) es una herramienta que  identifica posibles problemas con [place]. Proporciona una estimacin de la grasa corporal basndose en el peso y la altura. El mdico podr determinar su [facility] y podr ayudarlo a Personnel officer o Pharmacologist un peso saludable.Para los adultos de 20 aos o ms:  Un [facility] menor a 18,5 se considera bajo peso.  Un [facility] entre 18,5 y 24,9 es normal.  Un [facility] entre 25 y 29,9 es sobrepeso.  Un IMC entre 30 o ms es obesidad.  Mantenga un nivel normal de lpidos y colesterol en sangre practicando actividad fsica y minimizando la ingesta de grasas saturadas. Consuma una dieta balanceada y saludable, e incluya variedad de frutas y vegetales. Los [employer] Corporation de lpidos y Oncologist en sangre deben Games developer a los 20 aos y repetirse cada 5 aos. Si los niveles de colesterol son altos, tiene ms de 50 aos o tiene riesgo elevado de sufrir enfermedades cardacas, Pension scheme manager controlarse con ms frecuencia.Si tiene Ryerson Inc de lpidos y colesterol, debe recibir tratamiento con medicamentos, si la dieta y el ejercicio no son efectivos.  Si fuma, consulte con [employer] de las opciones para dejar de [place]. Si no lo hace, no comience.  Se recomienda realizar controles para el cncer de pulmn a los [employer] de 55 a 80 aos que tengan alto riesgo de Environmental education officer la enfermedad debido a sus antecedentes de tabaquismo. Se recomienda realizar una tomografa computarizada (TC) anual de dosis bajas en aquellas personas que tienen un promedio de 30 paquetes al ao de historia de tabaquismo y en la actualidad fuman o han dejado de fumar dentro de los ltimos 15 aos. Un paquete al ao de fumador  es fumar un promedio de 1 paquete de cigarrillos al da durante 1 ao (por ejemplo: 1 paquete al da durante 30 aos o dos paquetes al da durante 15 aos). El control anual debe continuar hasta que el fumador haya dejado de fumar durante al menos 15 aos. El screening anual debe interrumpirse en aquellas personas que  desarrollan un problema de salud que les impida seguir un tratamiento para el cncer de pulmn.  Si est embarazada no beba alcohol. Si est amamantando, beba alcohol con prudencia. Si elige beber alcohol, no se exceda de 1 medida por da. Se considera una medida a 12 onzas (355 ml) de cerveza, 5 onzas (148 ml) de vino, o 1,5 onzas (44 ml) de licor.  Evite el consumo de drogas. No comparta agujas. Pida ayuda si necesita asistencia o instrucciones con respecto a abandonar el consumo de alcohol, cigarrillos o drogas.  La hipertensin arterial causa enfermedades cardacas y [place] el riesgo de ictus. Debe controlar su presin arterial al menos cada 1 o 2 aos. La presin arterial elevada que persiste debe tratarse con medicamentos si la prdida de peso y el ejercicio no son efectivos.  Si tiene entre 55 y 70 aos, consulte a su mdico si debe tomar aspirina para prevenir enfermedades cardacas.  Los anlisis para la diabetes incluyen la toma de [place] de sangre para controlar el nivel de azcar en la sangre durante el [place]. Debe hacerlo cada 3 aos despus de los 45 aos si est dentro de su peso normal y sin factores de riesgo para la diabetes. Las pruebas deben comenzar a edades tempranas o llevarse a cabo con ms frecuencia si tiene sobrepeso y al menos 1 factor de riesgo para la diabetes.  Las evaluaciones para Market researcher de mama son un mtodo preventivo fundamental para las mujeres. Debe practicar la "autoconciencia de las mamas". Esto significa que Product/process development scientist apariencia normal de sus mamas y [employer] siente y puede incluir un autoexamen de Building control surveyor. Si detecta algn cambio, no importa cun pequeo sea, debe informarlo a su mdico. Las mujeres entre 20 y 40 aos deben hacer un examen clnico de las mamas como parte del examen regular de [place], cada 1 a 3 aos. Despus de los 40 aos deben Sprint Nextel Corporation. Deben hacerse una mamografa radiografa de mamas) cada ao, comenzando  a los 40 aos. Las mujeres con Brewing technologist de cncer de mama deben hablar con el mdico para hacer un estudio gentico. Las que tienen ms riesgo deben hablar con su mdico para hacerse una Health visitor (RMN) y [place] todos los aos.  La evaluacin del riesgo de sufrir cncer relacionado con el cncer de mama gentico (BRCA) se recomienda en aquellas mujeres que tienen familiares con cncer relacionados con el BRCA El cncer relacionado con BRCAincluye el cncer de mama, de ovario, de trompas y peritoneal. El hecho de tener familiares con estos tipos de cncer puede asociarse con un mayor riesgo de a sufrir cambios perjudiciales (mutaciones) en los genes para el cncer de mama BRCA1 y BRCA2. Los resultados de la evaluacin determinar la necesidad de asesoramiento gentico BRCA1 y BRCA2 y [place]. Los resultados de la evaluacin determinarn la necesidad de asesoramiento gentico y estudios para el BRCA1 y BRCA2.  El Papanicolau se realiza para diagnosticar cncer de cuello de tero. Muestra los cambios celulares en el cuello que pueden transformarse en cncer si no se tratan. El Papanicolau es un procedimiento por el que se obtienen clulas  de la parte inferior del tero (cuello) y son examinadas.  Las mujeres deben [employer] un test de Pap a partir de los [address]  [place] 21 y los 29 aos debe repetirse [address].  Luego de los 30 aos, debe realizarse un test de Papanicolau cada tres aos siempre que los 3 estudios anteriores sean normales.  Algunas mujeres sufren problemas mdicos que aumentan la probabilidad de Research officer, political party cervical. Consulte a su mdico acerca de estos problemas. Es muy importante que le informe a su mdico si aparecen nuevos problemas poco despus de su ltimo Papanicolaou. En estos casos, el mdico podr indicar que se realice el Papanicolaou con ms frecuencia.  Estas recomendaciones son las mismas para todas las mujeres hayan recibido o no la  vacuna para el VPH (virus del papiloma humano).  Si le han realizado una histerectoma por un problema que no era cncer u otra enfermedad que podra causar cncer, ya no necesitar un Papanicolau. Sin embargo, si ya no necesita hacerse un Papanicolau, es una buena idea hacerse un examen regularmente para asegurarse de que no hay otros problemas.  Si tiene entre 65 y 65 aos y ha tenido un Papanicolaou normal en los ltimos 10 aos, no ser ms necesario realizarlo. Sin embargo, si ya no necesita hacerse un Papanicolau, es una buena idea hacerse un examen regularmente para asegurarse de que no hay otros problemas.  Si ha recibido Pharmacist, community para Management consultant cervical o para una enfermedad que podra causar cncer, Musician un Papanicolaou y controles durante al menos 20 aos de concluir el tratamiento  Si no se ha Adult nurse examen con regularidad, Writer a [employer] factores de riesgo (como el tener un nuevo compaero sexual) para Occupational psychologist a Printmaker.  La prueba del VPH es un anlisis adicional que puede usarse para Engineer, site de cuello de tero. Esta prueba busca la presencia del virus que causa los cambios en el cuello. Las clulas que se recolectan durante el test de Pap pueden usarse para el VPH. La prueba para el VPH puede usarse para Development worker, community a mujeres de ms de 30 aos y debe usarse en mujeres de cualquier edad Cisco del test de Pap no sean claros. Despus de los 30 aos, las mujeres deben hacerse el anlisis para el VPH con la misma frecuencia que el test de Pap.  El cncer colorectal puede detectarse y con frecuencia puede prevenirse. La mayor parte de los estudios de rutina comienzan a los 50 aos y [place] 75 aos. Sin embargo, el mdico podr aconsejarle que lo haga antes, si tiene factores de riesgo para el cncer de colon. Una vez por ao, el profesional le dar un kit de prueba para Scientist, product/process development en la  materia fecal. La utilizacin de un tubo con una pequea cmara en su extremo para examinar directamente el colon (sigmoideoscopa o colonoscopa), puede detectar formas temprana de cncer colorectal. Hable con su mdico si tiene 50 aos, cuando comience con los estudios de [place]. El examen directo del colon debe repetirse cada 5 a 10 aos, hasta los 75 aos, excepto que se encuentren formas tempranas de plipos precancerosos o pequeos bultos.  Se recomienda realizar un anlisis de sangre para Engineer, manufacturing hepatitis C a todas las personas [address] 1944 y 1964, y a todo aquel que tenga un riesgo conocido de haber contrado esta enfermedad.  Practique el sexo seguro. Use condones y evite las prcticas sexuales  riesgosas para disminuir el contagio de enfermedades de transmisin sexual. Las enfermedades de transmisin sexual son la gonorrea, clamidia, sfilis, tricomonas, herpes, VPH y el virus de inmunodeficiencia humana (VIH). El herpes, el VIH y [place] VPH son enfermedades virales que no tienen [place]. Pueden producir discapacidad, cncer y [employer]. Las mujeres sexualmente activas de 25 aos o menos deben ser evaluadas para Engineer, manufacturing clamidia. Las Coca Cola que tengan mltiples compaeros tambin deben hacerse el anlisis para Engineer, manufacturing clamidia. Se recomienda realizar anlisis para detectar otras enfermedades de transmisin sexual si es sexualmente [place] y tiene riesgos.  La osteoporosis es una enfermedad en la que los huesos pierden los minerales y la fuerza por el avance de la edad. El resultado pueden ser fracturas graves en los [place]. El riesgo de osteoporosis puede identificarse con Neomia Dear prueba de densidad sea. Las mujeres de ms de 65 aos y las que tengan riesgos de sufrir fracturas u osteoporosis deben pedir consejo a su mdico. Consulte a su mdico si debe tomar un suplemento de calcio o de vitamina D para reducir el riesgo de osteoporosis.  La menopausia se asocia a sntomas y riesgos  fsicos. Se dispone de una terapia de reemplazo hormonal para disminuir los sntomas y [place]. Consulte a su mdico para saber si la terapia de reemplazo hormonal es conveniente para usted.  Use una pantalla solar. Aplique pantalla de Pietro Cassis y repetida a lo largo del Futures trader. Pngase al resguardo del sol cuando la sombra sea ms pequea que usted. Protjase usando mangas y [employer] Data, un sombrero de ala ancha y gafas para el sol todo el ao, siempre que se encuentre en el exterior.  Una vez por mes hgase un examen de la piel de todo el cuerpo usando un espejo para ver la espalda. Informe al mdico si aparecen nuevos lunares, o los que ya tena tienen bordes [address], los que sean ms grandes que una goma de lpiz o los que hayan cambiado su forma o color.  Mantngase al da con las vacunas.  [place] antigripal. Todos los adultos deben vacunarse todos [place].  Vacuna contra difteria, ttanos y tos Teacher, early years/pre (Td,Tdap). Las mujeres embarazadas deben recibir 1 dosis de vacuna Tdap en cada embarazo. La dosis debe aplicarse independientemente del tiempo transcurrido desde la ltima dosis. Se prefiere la vacunacin entre las semanas 27 y 28 de gestacin. Un adulto que no ha recibido Investment banker, corporate vacuna Tdap o que no sabe su estado de vacunacin deben recibir 1 dosis. Esta dosis inicial debe ser seguida por una dosis de refuerzo de toxoides tetnico y diftrico (Td) cada 10 aos. Los adultos con una historia desconocida o incompleta de vacunacin de 3 dosis de las vacunas Td deben comenzar o completar una serie de vacunas primaria incluyendo una dosis de Tdap. Los adultos deben recibir una dosis de refuerzo de Td cada 10 aos.  Vacuna contra la varicela. Todos los adultos sin evidencia de inmunidad a la varicela deben recibir 2 dosis o una segunda dosis si slo han recibido 1 dosis. Las mujeres embarazadas que no tengan evidencia de inmunidad deben recibir la primera dosis despus del  Psychiatrist. La primera dosis debe aplicarse antes de abandonar el establecimiento sanitario. La segunda dosis debe aplicarse de 4 a 8 semanas despus de la primera dosis.  Vacuna contra el virus del Geneticist, molecular (VPH). Las mujeres de 13 a 26 aos que no han recibido la vacuna previamente deben recibir Neomia Dear serie de 3 dosis. La vacuna no se  recomienda en mujeres embarazadas. Sin embargo, no es Passenger transport manager una prueba de [place] antes de recibir una dosis. Si se comprueba que una mujer est embarazada despus de haber recibido Neomia Dear dosis, no necesita tratamiento. En ese caso, las dosis restantes deben retrasarse hasta despus del embarazo. Se recomienda la aplicacin de la vacuna a cualquier persona con una enfermedad por inmunodeficiencia [employer] 26 aos, si no recibi [place] o ninguna de las dosis [place]. Durante la serie de 3 dosis, la segunda dosis debe aplicarse de 4 a 8 semanas despus de la primera dosis. La tercera dosis debe aplicarse 24 semanas despus de la primera dosis y 16 semanas despus de la segunda dosis.  Vacuna contra el herpes zoster. Se recomienda una dosis en adultos mayores de 60 aos a menos que sufran ciertas enfermedades.  Vacuna triple viral (sarampin, paperas y [place]) o MMR por su siglas en ingls. Los adultos nacidos antes de 1957 generalmente se consideran inmunes al sarampin y las paperas. Los adultos nacidos en 1957 o ms tarde deben recibir 1 o ms dosis de la vacuna MMR, a menos que [employer] contraindicacin para la vacuna o que tengan prueba de inmunidad a las tres enfermedades. Los estudiantes que asisten a escuelas de educacin superior, los trabajadores de la salud o los viajeros internacionales deben aplicarse una segunda dosis de rutina de la vacuna MMR por lo menos 28 das despus de la primera dosis. Las personas que recibieron la vacuna inactivada contra el sarampin o un tipo desconocido de Sharyl Nimrod [place] [id number] deben recibir  2 dosis de la vacuna MMR. Las personas que recibieron la vacuna inactivada contra las paperas o un tipo desconocido de vacuna contra las paperas antes de 1979 y se encuentran en alto riesgo de infeccin por paperas deben considerar la vacunacin con 2 dosis de la vacuna MMR. En las mujeres en edad frtil, debe determinarse la inmunidad contra la rubola. Si no hay prueba de inmunidad, las mujeres que no estn embarazadas deben vacunarse. Si no hay prueba de inmunidad, las mujeres que estn embarazadas deben posponer la vacunacin hasta despus de su embarazo. Los trabajadores de [employer] no vacunados nacidos antes de 1957 que no tengan evidencia de laboratorio de sarampin de inmunidad a las paperas o rubola o la confirmacin de laboratorio de la enfermedad deben considerar vacunarse contra el sarampin y las paperas con 2 dosis de la vacuna MMR o vacunarse contra la rubola con 1 dosis de la vacuna MMR.  Vacuna antineumoccica conjugada 13 valente (PCV13). Cuando est indicado, una persona que tenga dudas sobre su historial de vacunacin y no tenga antecedentes de vacunacin, debe recibir la vacuna PCV13. Un adulto de19 aos o ms, que sufra ciertas enfermedades y no haya sido vacunado previamente deben recibir 1 dosis de la vacuna PCV13. Despus de la PCV13 debe aplicarse una dosis de la vacuna antineumoccica de polisacridos (PPSV23). La dosis de la vacuna PPSV23 se debe aplicar por lo menos 8 semanas despus de la dosis de la vacuna PCV13. Un adulto de 19 aos o ms que sufra ciertas enfermedades y que haya recibido anteriormente1 o ms dosis de la vacuna PPSV23 debe recibir 1 dosis de PCV13. La dosis de la vacuna PCV13 se debe aplicar 1 o ms aos despus de la ltima dosis de la vacuna PPSV23.  Vacuna antineumoccica de polisacridos (PPSV23). Cuando se indica la vacuna PCV13, debe aplicarse primero. [employer] de 65 aos o mayores deben recibir 1 dosis. Los  adultos menores de 65 aos que sufran  ciertas enfermedades, deben vacunarse. Cualquier persona que resida en un hogar de ancianos o centro de atencin permanente debe vacunarse. Un fumador adulto debe vacunarse. Las personas con ciertas enfermedades por inmunodeficiencia y otras enfermedades deben recibir tanto la PCV13 como la PPSV23. Las personas con el virus de inmunodeficiencia humana (VIH ) deben vacunarse tan pronto como sea posible despus del diagnstico. La vacunacin durante la quimioterapia o la terapia de radiacin debe evitarse. No se recomienda el uso rutinario de la vacuna PPSV23 en los indoamericanos, los nativos de [place] o personas menores de 65 aos excepto que existan condiciones mdicas que requieren la vacuna PPSV23. Cuando est indicado, las personas que no saben si han sido vacunadas y no tienen antecedentes de vacunacin deben recibir la vacuna PPSV23. Se recomienda a las [place] [employer] 19 y 35 aos con insuficiencia renal crnica, sndrome nefrtico, asplenia o pacientes inmunocomprometidos, la revacunacin por nica vez 5 aos despus de la primera dosis de PPSV23. Las Eli Lilly and Company recibieron 1 o 2 dosis de PPSV23 antes de los 65 aos de edad deben recibir otra dosis a los 65 aos o ms tarde si han pasado al menos 5 aos desde la ltima dosis. Las dosis de PPSV23 no son necesarias para las personas vacunadas despus de la edad de 65 aos.  Vacuna antimeningoccica. Los adultos con asplenia o deficiencias persistentes de componentes complementarios deben recibir 2 dosis de la vacuna cuadrivalente meningoccica conjugada (MenACWY-D). Estas dosis deben administrarse al menos con 2 meses de diferencia. Los microbilogos que trabajan con ciertas bacterias meningoccicas, los militares reclutados, las personas en riesgo durante un brote y los que viajen o vivan en pases con alta tasa de meningitis, deben vacunarse. Los estudiantes universitarios del Water engineer la edad de 21 aos que viven en residencias deben recibir 1  dosis si no la recibieron durante o despus de su cumpleaos nmero 16. Los adultos que sufren ciertas enfermedades de alto riesgo deben recibir una o ms dosis de la vacuna.  Vacuna contra la hepatitis A. Los adultos que deseen estar protegidos contra esta enfermedad, que sufren ciertas enfermedades de alto riesgo, los que trabajan con animales infectados con hepatitis A, los que trabajan en los laboratorios de investigacin de hepatitis A o que viajan o trabajan en pases con una alta tasa de hepatitis A deben vacunarse. Los adultos que no fueron vacunados previamente y que Zenaida Niece a tener un contacto cercano con una persona adoptada fuera del pas, deben recibir la vacuna durante los primeros [address] despus de su llegada a los [place] desde un pas con una alta tasa de hepatitis A.  Vacuna contra la hepatitis B. Los adultos que deseen estar protegidos contra esta enfermedad, que sufren ciertas enfermedades de alto riesgo, que puedan estar expuestos a sangre u otros fluidos corporales infecciosos, que tienen contactos familiares o parejas sexuales con hepatitis B positivo, que sean clientes o trabajadores de ciertos centros de atencin, o que viajan o trabajan en pases con una alta tasa de hepatitis B deben vacunarse.  Haemophilus influenzae tipo b (Hib). Una persona que no ha sido vacunada que sufre asplenia o anemia de clulas falciformes o tiene una esplenectoma programada, debe recibir 1 dosis de la vacuna Hib. Independientemente de la vacunacin anterior, un receptor de un trasplante de clulas madre hematopoyticas debe recibir Neomia Dear serie de 3 dosis, 6 a12 meses despus de su trasplante exitoso. La vacuna Hib no se recomienda para los adultos con infeccin  por VIH. Controles preventivos / Frecuencia Edad 19 a 39  Control de la presin arterial.** / Cada 1 a 2 aos.  Control de lpidos y colesterol.** / Cada 5 aos, comenzando a los 20 aos.  Examen clnico de mamas.** / Cada 3 aos en las  [employer] 20 y los [address] [place].  Evaluacin del riesgo del cncer de mama relacionado con BRCA.** / En las mujeres que tienen familiares con cncer relacionado con BRCA (mama, ovarios, trompas o peritoneal).  Papanicolau.** / Cada 2 aos [employer] 21 y los 29 aos. [place] 3 aos despus de los [address] [place], y [place] 65 o 70, con una historia de 3 papanicolau normales consecutivos.  Pruebas para el VPH.** / Cada 3 aos despus de los [address] [place], y [place] 65 o 70, con una historia de 3 papanicolau normales consecutivos.  Anlisis de sangre para la hepatitis C.** / Para todo individuo con riesgos conocidos para la hepatitis C.  Autoexamen de piel. / Todos los [place].  [place] antigripal. / [employer].  Vacuna contra difteria, ttanos y tos Teacher, early years/pre (Tdap, Td).** / Consltelo con el mdico. Las mujeres embarazadas deben recibir 1 dosis de vacuna Tdap en cada embarazo. Una dosis nica de vacuna Td cada 10 aos.  Vacuna contra la varicela.** / Consltelo con el mdico. Las mujeres embarazadas que no tengan evidencia de inmunidad deben recibir la primera dosis despus del Psychiatrist.  Vacuna contra el virus del Geneticist, molecular (VPH). / 3 dosis en el curso de 6 meses, si tiene 26 aos o menos. La vacuna no se recomienda en mujeres embarazadas. Sin embargo, no es Passenger transport manager una prueba de [place] antes de recibir una dosis.  Vacuna triple viral (sarampin, paperas y [place]) o MMR por su siglas en ingls.** / Debe aplicarse al menos una dosis de MMR si ha nacido en 1957 o despus. Podra tambin necesitar una 2da dosis. En las mujeres en edad frtil, debe determinarse la inmunidad contra la rubola. Si no hay prueba de inmunidad, las mujeres que no estn embarazadas deben vacunarse. Si no hay prueba de inmunidad, las mujeres que estn embarazadas deben posponer la vacunacin hasta despus de su embarazo.  Vacuna antineumoccica conjugada 13 valente (PCV13).** / Consltelo con el  mdico.  Vacuna antineumoccica de polisacridos (PPSV23).** / Debe aplicarse 1  2 dosis si fuma o si sufre ciertas condiciones.  Vacuna antimeningoccica.** / Si tiene entre 19 y 59 aos y es un estudiante universitario de Therapist, occupational que vive en una residencia estudiantil, o tiene alguna enfermedad mdica, debe recibir esta vacuna. Podra tambin necesitar dosis de refuerzo.  Vacuna contra la hepatitis A.** / Consltelo con el mdico.  Vacuna contra la hepatitis B.** / Consltelo con el mdico.  Haemophilus influenzae tipo b (Hib).** / Consltelo con el mdico. Edad 40 a 64  Control de la presin arterial.** / Cada 1 a 2 aos.  Control de lpidos y colesterol.** / Cada 5 aos, comenzando a los 20 aos.  Control del cncer de pulmn. / Cada ao si tiene entre 55 y 60 aos y tiene un promedio de 30 paquetes al ao de historia de tabaquismo y en la actualidad fumar o ha dejado dentro de los ltimos 15 aos. El control anual se detiene si ha dejado de fumar durante al menos 15 aos o tiene problemas de salud que le impiden seguir un tratamiento para el cncer de pulmn.  Examen clnico de mamas.** / Todos los aos despus de  los 40 aos.  Evaluacin del riesgo del cncer de mama relacionado con BRCA.** / En las mujeres que tienen familiares con cncer relacionado con BRCA (mama, ovarios, trompas o peritoneal).  Mamografa.** / Neomia Dear vez por ao a partir de los 40 aos y continuando siempre que tenga buena salud. Consulte con el mdico.  Papanicolau.** / Cada 3 aos despus de los [address] [place], y [place] 65 o 70, con una historia de 3 papanicolau normales consecutivos.  Pruebas para el VPH** / Cada 3 aos despus de los [address] [place], y [place] 65 o 70, con una historia de 3 papanicolau normales consecutivos.  Prueba de sangre oculta en materia fecal. / Cada ao comenzando a los 50 aos continuando [employer] 75. No tendr que hacerlo si se ha hecho una colonoscopa cada 10 aos.  Sigmoidoscopa  flexible** o colonoscopa.** / Cada 5 aos para la sigmoidoscopa flexible o cada 10 aos para la colonoscopa, comenzando a los 50 aos y continuando [employer] 75 aos.  Anlisis de sangre para la hepatitis C.** / Para todas las personas [address] 1944 y 96 y a todo aquel que tenga un riesgo conocido para la hepatitis C.  Autoexamen de piel. / Todos los [place].  [place] antigripal. / [employer].  Vacuna contra difteria, ttanos y tos Teacher, early years/pre (Tdap, Td).** / Consltelo con el mdico. Las mujeres embarazadas deben recibir 1 dosis de vacuna Tdap en cada embarazo. Una dosis nica de vacuna Td cada 10 aos.  Vacuna contra la varicela.** / Consltelo con el mdico. Las mujeres embarazadas que no tengan evidencia de inmunidad deben recibir la primera dosis despus del Psychiatrist.  Vacuna contra el herpes zoster.** / Una dosis en adultos mayores de 60 aos.  Vacuna triple viral (sarampin, paperas y [place]) o MMR por su siglas en ingls.** / Debe aplicarse al menos una dosis de MMR si ha nacido en 1957 o despus. Podra tambin necesitar una 2da dosis. En las mujeres en edad frtil, debe determinarse la inmunidad contra la rubola. Si no hay prueba de inmunidad, las mujeres que no estn embarazadas deben vacunarse. Si no hay prueba de inmunidad, las mujeres que estn embarazadas deben posponer la vacunacin hasta despus de su embarazo.  Vacuna antineumoccica conjugada 13 valente (PCV13).** / Consltelo con el mdico.  Vacuna antineumoccica de polisacridos (PPSV23).** / Debe aplicarse 1  2 dosis si fuma o si sufre ciertas condiciones.  Vacuna antimeningoccica.** / Consltelo con el mdico.  Vacuna contra la hepatitis A.** / Consltelo con el mdico.  Vacuna contra la hepatitis B.** / Consltelo con el mdico.  Haemophilus influenzae tipo b (Hib).** / Consltelo con el mdico. Edad 65 o ms  Control de la presin arterial.** / Cada 1 a 2 aos.  Control de lpidos y  colesterol.** / Cada 5 aos, comenzando a los 20 aos.  Control del cncer de pulmn. / Cada ao si tiene entre 55 y 49 aos y tiene un promedio de 30 paquetes al ao de historia de tabaquismo y en la actualidad fuma o ha dejado dentro de los ltimos 15 aos. El control anual se detiene si ha dejado de fumar durante al menos 15 aos o tiene problemas de salud que le impiden seguir un tratamiento para el cncer de pulmn.  Examen clnico de mamas.** / Todos los aos despus de los 40 aos.  Evaluacin del riesgo del cncer de mama relacionado con BRCA.** / En las mujeres que tienen familiares con cncer relacionado con BRCA (mama,  ovarios, trompas o peritoneal).  Mamografa.** / Neomia Dear vez por ao a partir de los 40 aos y continuando siempre que tenga buena salud. Consulte con el mdico.  Papanicolau.** / Cada 3 aos despus de los [address] [place], y [place] 65 o 70, con una historia de 3 papanicolau normales consecutivos. Las pruebas pueden [employer] 65 y los 70 aos, si tiene 3 papanicolau consecutivos normales y no tuvo un papanicoalu ni prueba de VPH [employer] ltimos 10 aos.  Pruebas para el VPH** / Cada 3 aos despus de los [address] [place], y [place] 65 o 70, con una historia de 3 papanicolau normales consecutivos. Las pruebas pueden [employer] 65 y los 70 aos, si tiene 3 papanicolau consecutivos normales y no tuvo un papanicoalu ni prueba de VPH [employer] ltimos 10 aos.  Prueba de sangre oculta en materia fecal. / Cada ao comenzando a los 50 aos continuando [employer] 75. No tendr que hacerlo si se ha hecho una colonoscopa cada 10 aos.  Sigmoidoscopa flexible** o colonoscopa.** / Cada 5 aos para la sigmoidoscopa flexible o cada 10 aos para la colonoscopa, comenzando a los 50 aos y continuando [employer] 75 aos.  Anlisis de sangre para la hepatitis C.** / Para todas las personas [address] 1944 y 65 y a todo aquel que tenga un riesgo conocido para la  hepatitis C.  Pruebas para la osteoporosis.** / Por nica vez en mujeres de ms de 65 aos que tengan riesgo de fracturas u osteoporosis.  Autoexamen de piel. / Todos los [place].  [place] antigripal. / [employer].  Vacuna contra difteria, ttanos y tos Teacher, early years/pre (Tdap, Td).** / Una dosis nica de vacuna Td cada 10 aos.  Vacuna contra la varicela.** / Consltelo con el mdico.  Vacuna contra el herpes zoster.** / Una dosis en adultos mayores de 60 aos.  Vacuna antineumoccica conjugada 13 valente (PCV13).** / Consltelo con el mdico.  Vacuna antineumoccica de polisacridos (PPSV23).** / Necesitar 1 dosis a los 65 aos o ms.  Vacuna antimeningoccica.** / Consltelo con el mdico.  Vacuna contra la hepatitis A.** / Consltelo con el mdico.  Vacuna contra la hepatitis B.** / Consltelo con el mdico.  Haemophilus influenzae tipo b (Hib).** / Consltelo con el mdico. **La historia familiar y personal de riesgos y enfermedades puede cambiar las recomendaciones del mdico. Document Released: 12/04/2004 Document Revised: 06/21/2012 ExitCare Patient Information 2014 [place], [place].

## 2013-02-21 NOTE — Progress Notes (Signed)
  Subjective:     Veronica Hart is a 57 y.o. [phone] postmenopausal female and is here for a comprehensive physical exam. Patient is Spanish-speaking only, Spanish interpreter present for this encounter. The patient reports no problems.  Last pap was three years ago, denies any history of cervical dysplasia.  Not sexually active.    History   Social History  . Marital Status: Married    Spouse Name: N/A    Number of Children: N/A  . Years of Education: N/A   Occupational History  . Not on file.   Social History Main Topics  . Smoking status: Never Smoker   . Smokeless tobacco: Not on file  . Alcohol Use: No  . Drug Use: No  . Sexual Activity: No   Other Topics Concern  . Not on file   Social History Narrative  . No narrative on file   Health Maintenance  Topic Date Due  . Pap Smear  07/30/1974  . Tetanus/tdap  07/30/1975  . Colonoscopy  07/30/2006  . Influenza Vaccine  10/08/2013  . Mammogram  02/15/2015    The following portions of the patient's history were reviewed and updated as appropriate: allergies, current medications, past family history, past medical history, past social history, past surgical history and problem list.  Normal mammogram on 02/15/13.  Review of Systems Pertinent items are noted in HPI.   Objective:   BP 131/79  Pulse 72  Temp(Src) 97.8 F (36.6 C) (Oral)  Ht 4\' 10"  (1.473 m)  Wt 135 lb 6.4 oz (61.417 kg)  BMI 28.31 kg/m2 GENERAL: Well-developed, well-nourished female in no acute distress.  HEENT: Normocephalic, atraumatic. Sclerae anicteric.  NECK: Supple. Normal thyroid.  LUNGS: Clear to auscultation bilaterally.  HEART: Regular rate and rhythm. BREASTS: Symmetric in size. No masses, skin changes, nipple drainage, or lymphadenopathy. ABDOMEN: Soft, nontender, nondistended. No organomegaly. PELVIC: Normal external female genitalia. Vagina is pink with moderate atrophy.  Scant clear vaginal discharge. Normal cervix contour with  atrophy noted. Pap smear obtained; endocervical brush used to breach the mild stenosis noted in the endocervical canal.  Scant cervical bleeding noted after pap.  Uterus is normal in size. No adnexal mass or tenderness.  EXTREMITIES: No cyanosis, clubbing, or edema, 2+ distal pulses.   Assessment:    Healthy female exam.    Plan:   Pap done, will follow up results and manage accordingly. Routine preventative health maintenance measures emphasized  Jaynie Collins, MD, FACOG Attending Obstetrician & Gynecologist Faculty Practice, [facility]

## 2013-04-18 ENCOUNTER — Encounter: Payer: No Typology Code available for payment source | Admitting: Gastroenterology

## 2013-05-17 ENCOUNTER — Ambulatory Visit: Payer: No Typology Code available for payment source | Attending: Internal Medicine | Admitting: Internal Medicine

## 2013-05-17 ENCOUNTER — Encounter: Payer: Self-pay | Admitting: Internal Medicine

## 2013-05-17 VITALS — BP 150/86 | HR 70 | Temp 97.9°F | Resp 16 | Ht 59.84 in | Wt 138.0 lb

## 2013-05-17 DIAGNOSIS — M1990 Unspecified osteoarthritis, unspecified site: Secondary | ICD-10-CM | POA: Insufficient documentation

## 2013-05-17 DIAGNOSIS — M129 Arthropathy, unspecified: Secondary | ICD-10-CM

## 2013-05-17 DIAGNOSIS — R7309 Other abnormal glucose: Secondary | ICD-10-CM | POA: Insufficient documentation

## 2013-05-17 DIAGNOSIS — R7303 Prediabetes: Secondary | ICD-10-CM

## 2013-05-17 DIAGNOSIS — Z09 Encounter for follow-up examination after completed treatment for conditions other than malignant neoplasm: Secondary | ICD-10-CM | POA: Insufficient documentation

## 2013-05-17 LAB — POCT GLYCOSYLATED HEMOGLOBIN (HGB A1C): HEMOGLOBIN A1C: 5.7

## 2013-05-17 MED ORDER — NAPROXEN 500 MG PO TABS: 500.0000 mg | ORAL_TABLET | Freq: Two times a day (BID) | ORAL | Status: DC

## 2013-05-17 NOTE — Progress Notes (Signed)
Pt is here following up on her arthritis. Pt is requesting to have lab work done today.

## 2013-05-17 NOTE — Progress Notes (Signed)
Patient ID: Veronica Hart, female   DOB: [birth year], 58 y.o.   MRN: [id number]   Veronica Hart, is a 58 y.o. female  XTA:[id number]  [id number]  DOB - Sep 20, 1955  Chief Complaint  Patient presents with  . Follow-up        Subjective:   Veronica Hart is a 58 y.o. female here today for a follow up visit. Patient is here to follow up with her osteoarthritis. She takes naproxen or ibuprofen for pain with good effect. She has hemoglobin A1c in the prediabetic range, due for repeat today. She has no significant complaint. She does not have personal history of hypertension. She does not smoke cigarette she does not drink alcohol. She's not on any medication for chronic disease except for occasional NSAID for pain. Patient has No headache, No chest pain, No abdominal pain - No Nausea, No new weakness tingling or numbness, No Cough - SOB.  Problem  Prediabetes    ALLERGIES: No Known Allergies  PAST MEDICAL HISTORY: Past Medical History  Diagnosis Date  . Arthritis     MEDICATIONS AT HOME: Prior to Admission medications   Medication Sig Start Date End Date Taking? Authorizing Provider  naproxen (NAPROSYN) 500 MG tablet Take 1 tablet (500 mg total) by mouth 2 (two) times daily with a meal. 05/17/13  Yes Angelica Chessman, MD  ibuprofen (ADVIL,MOTRIN) 400 MG tablet Take 2 tablets (800 mg total) by mouth every 8 (eight) hours as needed for mild pain. 01/11/13   Shanker Kristeen Mans, MD  LORATADINE PO Take by mouth.    Historical Provider, MD     Objective:   Filed Vitals:   05/17/13 1107  BP: 150/86  Pulse: 70  Temp: 97.9 F (36.6 C)  TempSrc: Oral  Resp: 16  Height: 4' 11.84" (1.52 m)  Weight: 138 lb (62.596 kg)  SpO2: 97%    Exam General appearance : Awake, alert, not in any distress. Speech Clear. Not toxic looking HEENT: Atraumatic and Normocephalic, pupils equally reactive to light and accomodation Neck: supple, no JVD. No cervical lymphadenopathy.    Chest:Good air entry bilaterally, no added sounds  CVS: S1 S2 regular, no murmurs.  Abdomen: Bowel sounds present, Non tender and not distended with no gaurding, rigidity or rebound. Extremities: B/L Lower Ext shows no edema, both legs are warm to touch Neurology: Awake alert, and oriented X 3, CN II-XII intact, Non focal Skin:No Rash Wounds:N/A  Data Review Lab Results  Component Value Date   HGBA1C 6.3* 01/17/2013   HGBA1C  Value: 6.0 (NOTE)   The ADA recommends the following therapeutic goal for glycemic   control related to Hgb A1C measurement:   Goal of Therapy:   < 7.0% Hgb A1C   Reference: American Diabetes Association: Clinical Practice   Recommendations 2008, Diabetes Care,  2008, 31:(Suppl 1). 02/21/2008     Assessment & Plan   1. Arthritis Prescribed - naproxen (NAPROSYN) 500 MG tablet; Take 1 tablet (500 mg total) by mouth 2 (two) times daily with a meal.  Dispense: 60 tablet; Refill: 1  2. Prediabetes  - POCT glycosylated hemoglobin (Hb A1C)  Patient was extensively counseled on nutrition and exercise  Return in about 6 months (around 11/17/2013), or if symptoms worsen or fail to improve, for Hemoglobin A1C and Follow up, DM.  The patient was given clear instructions to go to ER or return to medical center if symptoms don't improve, worsen or new problems develop. The patient verbalized understanding. The patient was told to  call to get lab results if they haven't heard anything in the next week.   This note has been created with Surveyor, quantity. Any transcriptional errors are unintentional.    Angelica Chessman, MD, [place], [place], [place] and [place], [place]   05/17/2013, 11:52 AM

## 2013-05-17 NOTE — Patient Instructions (Signed)
Gua de planeamiento de la alimentacin para diabticos (Diabetes Meal Planning Guide) La gua de planeamiento de alimentacin para diabticos es una herramienta para ayudarlo a planear sus comidas y colaciones. Es importante para las personas con diabetes controlar sus niveles de Location manager. Elegir los [employer] correctos y las cantidades adecuadas durante el da le ayudar a Media planner. Comer bien puede incluso ayudarlo a mejorar la presin sangunea y Science writer o Theatre manager un peso saludable. CUENTE LOS HIDRATOS DE CARBONO CON FACILIDAD Cuando consume hidratos de carbono, stos se transforman en azcar (glucosa). Esto a su vez Agricultural consultant de Museum/gallery exhibitions officer. El conteo de carbohidratos puede ayudarlo a Chief Technology Officer este nivel para que se sienta mejor. Al planear sus alimentos con el conteo de carbohidratos, podr tener ms flexibilidad en lo que come y Curator con el consumo de alimentos. El conteo de carbohidratos significa simplemente sumar la cantidad total de gramos de carbohidratos a sus comidas o colaciones. Trate de consumir la misma cantidad en cada comida. A continuacin encontrar una lista de 1 porcin o 15 gr. de carbohidratos. A continuacin se enumeran. Pregunte al mdico cuntos gramos de carbohidratos necesita comer en cada comida o colacin. Almidones y granos  1 [place] de pan.   bollo ingls o bollo para hamburguesa o hotdog.   taza de cereal fro (sin azcar).   taza de pasta o arroz cocido.   taza de vegetales que contengan almidn (maz, papas, arvejas, porotos, calabaza).  1 omelette (6 pulgadas).   bollo.  1 waffle o panqueque (del tamao de un CD).   taza de cereales cocidos.  4 a 6 galletas saldas pequeas. *Se recomienda el consumo de granos enteros. Frutas  1 taza de frutos rojos, meln, papaya o anan sin azcar.  1 fruta fresca pequea.   banana o mango.   taza de jugo de frutas (4 onzas sin endulzar).    taza de fruta envasada en jugo natural o agua.  2 cucharadas de frutas secas.  12-15 uvas o cerezas. Leche y yogurt  1 taza de [employer] o al 1%.  [place].  6 onzas de yogurt descremado con edulcorante sin azcar.  6 onzas de yogur descremado de soja.  6 onzas de yogur natural. Vegetales  1 taza de vegetales crudos o  de vegetales cocidos se considera cero carbohidratos o una comida "libre".  Si come 3 o ms porciones en una comida, cuntelas como 1 porcin de carbohidratos. Otros carbohidratos   onzas de chips o pretzels.   taza de helado de crema o yogur helado.   taza de helado de agua.  5 cm de torta no congelada.  1 cucharada de miel, azcar, mermelada, jalea o almbar.  2 galletitas dulces pequeas.  3 cuadrados de crackers de graham.  3 tazas de palomitas de maz.  6 crackers.  1 taza de caldo.  Cuente 1 taza de guisado u otra mezcla de alimentos como 2 porciones de carbohidratos.  Los alimentos con menos de 20 caloras por porcin deben contarse como cero carbohidratos o alimento "libre". Si lo desea compre un libro o software de computacin que enumere la cantidad de gramos de carbohidratos de los diferentes alimentos. Adems, el panel nutricional en las etiquetas de los productos que consume es una buena fuente de informacin. Le indicar el tamao de la porcin y la cantidad total de carbohidratos que consumir por cada una. Divida este nmero por 15 para obtener el nmero  de conteo de carbohidratos por porcin. Recuerde: cada porcin son 15 gramos de carbohidratos. PORCIONES La medicin de los alimentos y el tamao de las porciones lo ayudarn a controlar la cantidad exacta de comida que debe ingerir. La lista que sigue le mostrar el tamao de algunas porciones comunes.   1 onza.................4 dados apilados.  3 onzas...............Un mazo de cartas.  1 cucharadita.....La punta de un dedo pequeo.  1  cucharada........Un dedo.  2 cucharadas......Una pelota de golf.   taza...............La mitad de un puo.  1 taza................Un puo. EJEMPLO DE PLAN DE ALIMENTACIN PARA DIABTICOS: A continuacin se muestra un ejemplo de plan de alimentacin que incluye comidas de los grupos de granos y fculas, vegetales, frutas y carnes. Un nutricionista podr confeccionarle un plan individualizado para cubrir sus necesidades calricas y decirle el nmero de porciones que necesita de cada grupo. Sin embargo, podra intercambiar los alimentos que contengan carbohidratos (lcteos, cereales y frutas). Controlar la cantidad total de carbohidratos en los alimentos o colaciones es ms importante que asegurarse de incluir todos los grupos alimenticios cada vez que come.  El siguiente plan de alimentacin es un ejemplo de una dieta de 2000 caloras mediante el conteo de carbohidratos. Este plan contiene 17 porciones de carbohidratos. Desayuno  1 taza de avena (2 porciones de carbohidratos).   taza de yogur light(1 porcin de carbohidratos).  1 taza de arndanos (1 porcin de carbohidratos).   taza de almendras. Colacin  1 manzana grande (2 porciones de carbohidratos).  1 palito de queso bajo en grasa. Almuerzo  Ensalada de pechuga de pollo.  1 taza de espinacas.   taza de tomates cortados.  2 oz (60 gr) de pechuga de pollo en rebanadas.  2 cucharadas de aderezo italiano bajo en contenido graso.  12 galletas integrales (2 porciones de carbohidratos).  12 a 15 uvas (1 porcin de carbohidratos).  1 taza de leche descremada (1porcin de carbohidratos). Colacin  1 taza de zanahorias.   taza de pur de garbanzos (1 porcin de carbohidratos). Cena  3 oz (80 gr) de salmn a la parrilla.  1 taza de arroz integral (3 porciones de carbohidratos). Colacin  1  taza de brcoli al vapor (1 porcin de carbohidrato) con una cucharadita de aceite de oliva y jugo de limn.  1 taza de  budn light (2 porciones de carbohidratos). HOJA DE PLANEAMIENTO DE LA ALIMENTACIN: El dietista podr utilizar esta hoja para ayudarlo a decidir cuntas porciones y qu tipos de alimentos son los adecuados para usted.  DESAYUNO Grupo de alimentos y porciones / Alimento elegido Granos/Fculas_________________________________________________ Lcteos________________________________________________________ Vegetales ______________________________________________________ Fruta __________________________________________________________ Carnes _________________________________________________________ Grasas _________________________________________________________ ALMUERZO Grupo de alimentos y porciones / Alimento elegido Granos/Fculas___________________________________________________ Lcteos_________________________________________________________ Fruta ___________________________________________________________ Carnes __________________________________________________________ Grasas __________________________________________________________ CENA Grupo de alimentos y porciones / Alimento elegido Granos/Fculas___________________________________________________ Lcteos_________________________________________________________ Fruta ___________________________________________________________ Carnes __________________________________________________________ Grasas __________________________________________________________ COLACIN Grupo de alimentos y porciones / Alimento elegido Granos/Fculas_________________________________________________ Lcteos________________________________________________________ Vegetales ______________________________________________________ Fruta _________________________________________________________ Carnes ________________________________________________________ Grasas ________________________________________________________ TOTALES  DIARIOS Fculas_______________________________________________________ Vegetales _____________________________________________________ Fruta ________________________________________________________ Lcteos_______________________________________________________ Carnes________________________________________________________ Grasas ________________________________________________________ Document Released: 06/03/2007 Document Revised: 05/19/2011 ExitCare Patient Information 2014 [employer].  

## 2013-05-27 ENCOUNTER — Telehealth: Payer: Self-pay | Admitting: Internal Medicine

## 2013-05-27 NOTE — Telephone Encounter (Signed)
Pt son per interpretor given lab visit

## 2013-05-27 NOTE — Telephone Encounter (Signed)
Pt called regarding her results, please contact pt °

## 2013-06-06 ENCOUNTER — Ambulatory Visit: Payer: No Typology Code available for payment source | Attending: Internal Medicine

## 2013-11-15 ENCOUNTER — Ambulatory Visit: Payer: Self-pay | Attending: Internal Medicine

## 2013-11-21 ENCOUNTER — Encounter: Payer: Self-pay | Admitting: Internal Medicine

## 2013-11-21 ENCOUNTER — Ambulatory Visit: Payer: Self-pay | Attending: Internal Medicine | Admitting: Internal Medicine

## 2013-11-21 VITALS — BP 136/77 | HR 72 | Temp 98.6°F | Resp 18 | Ht <= 58 in | Wt 142.0 lb

## 2013-11-21 DIAGNOSIS — Z5189 Encounter for other specified aftercare: Secondary | ICD-10-CM

## 2013-11-21 DIAGNOSIS — M1990 Unspecified osteoarthritis, unspecified site: Secondary | ICD-10-CM

## 2013-11-21 DIAGNOSIS — M129 Arthropathy, unspecified: Secondary | ICD-10-CM | POA: Insufficient documentation

## 2013-11-21 DIAGNOSIS — R7303 Prediabetes: Secondary | ICD-10-CM

## 2013-11-21 DIAGNOSIS — Z1211 Encounter for screening for malignant neoplasm of colon: Secondary | ICD-10-CM

## 2013-11-21 DIAGNOSIS — T7840XD Allergy, unspecified, subsequent encounter: Secondary | ICD-10-CM

## 2013-11-21 DIAGNOSIS — J309 Allergic rhinitis, unspecified: Secondary | ICD-10-CM | POA: Insufficient documentation

## 2013-11-21 DIAGNOSIS — R7309 Other abnormal glucose: Secondary | ICD-10-CM | POA: Insufficient documentation

## 2013-11-21 LAB — POCT GLYCOSYLATED HEMOGLOBIN (HGB A1C): Hemoglobin A1C: 5.9

## 2013-11-21 MED ORDER — LORATADINE 10 MG PO TABS: 10.0000 mg | ORAL_TABLET | Freq: Every day | ORAL | Status: DC

## 2013-11-21 MED ORDER — NAPROXEN 500 MG PO TABS: 500.0000 mg | ORAL_TABLET | Freq: Two times a day (BID) | ORAL | Status: DC

## 2013-11-21 NOTE — Progress Notes (Signed)
Patient ID: Veronica Hart, female   DOB: 08-May-1955, 58 y.o.   MRN: [id number]   Veronica Hart, is a 58 y.o. female  [id number]  [id number]  DOB - 1956-02-15  Chief Complaint  Patient presents with  . Follow-up  . Knee Pain        Subjective:   Veronica Hart is a 58 y.o. female here today for a follow up visit. Patient is here complaining of pain in both knees which is worsening especially on standing and with movement. Patient also complaining of pain in both wrists for like a month radiating to the hands. Patient is due for colonoscopy. Patient only history is significant for osteoarthritis. There is no redness, no swelling of any joints, no stiffness. Patient has no personal history of autoimmune disorder. No imaging studies done in the past. Patient has No headache, No chest pain, No abdominal pain - No Nausea, No new weakness tingling or numbness, No Cough - SOB.  Problem  Allergy  Colon Cancer Screening    ALLERGIES: No Known Allergies  PAST MEDICAL HISTORY: Past Medical History  Diagnosis Date  . Arthritis     MEDICATIONS AT HOME: Prior to Admission medications   Medication Sig Start Date End Date Taking? Authorizing Provider  ibuprofen (ADVIL,MOTRIN) 400 MG tablet Take 2 tablets (800 mg total) by mouth every 8 (eight) hours as needed for mild pain. 01/11/13   Shanker Kristeen Mans, MD  loratadine (CLARITIN) 10 MG tablet Take 1 tablet (10 mg total) by mouth daily. 11/21/13   Tresa Garter, MD  naproxen (NAPROSYN) 500 MG tablet Take 1 tablet (500 mg total) by mouth 2 (two) times daily with a meal. 11/21/13   Tresa Garter, MD     Objective:   Filed Vitals:   11/21/13 1717  BP: 136/77  Pulse: 72  Temp: 98.6 F (37 C)  TempSrc: Oral  Resp: 18  Height: 4\' 10"  (1.473 m)  Weight: 142 lb (64.411 kg)  SpO2: 99%    Exam General appearance : Awake, alert, not in any distress. Speech Clear. Not toxic looking HEENT: Atraumatic and  Normocephalic, pupils equally reactive to light and accomodation Neck: supple, no JVD. No cervical lymphadenopathy.  Chest:Good air entry bilaterally, no added sounds  CVS: S1 S2 regular, no murmurs.  Abdomen: Bowel sounds present, Non tender and not distended with no gaurding, rigidity or rebound. Extremities: B/L Lower Ext shows no edema, both legs are warm to touch Neurology: Awake alert, and oriented X 3, CN II-XII intact, Non focal  Data Review Lab Results  Component Value Date   HGBA1C 5.9 11/21/2013   HGBA1C 5.7 05/17/2013   HGBA1C 6.3* 01/17/2013     Assessment & Plan   1. Prediabetes  - HgB A1c - ANA - Lipid panel  2. Arthritis  - naproxen (NAPROSYN) 500 MG tablet; Take 1 tablet (500 mg total) by mouth 2 (two) times daily with a meal.  Dispense: 60 tablet; Refill: 1  3. Allergy, subsequent encounter  - loratadine (CLARITIN) 10 MG tablet; Take 1 tablet (10 mg total) by mouth daily.  Dispense: 30 tablet; Refill: 3  4. Colon cancer screening  - HM COLONOSCOPY - Ambulatory referral to Gastroenterology  Interpreter was used to communicate directly with patient for the entire encounter including providing detailed patient instructions.   Return in about 6 months (around 05/22/2014), or if symptoms worsen or fail to improve, for Follow up Pain and comorbidities.  The patient was given clear instructions to go  to ER or return to medical center if symptoms don't improve, worsen or new problems develop. The patient verbalized understanding. The patient was told to call to get lab results if they haven't heard anything in the next week.   This note has been created with Surveyor, quantity. Any transcriptional errors are unintentional.    Angelica Chessman, MD, [place], [place], [place] and [place], [place]   11/21/2013, 5:43 PM

## 2013-11-21 NOTE — Progress Notes (Signed)
Pt here with c/o both knee pain worsening with standing and movement Arthritic pain in both wrist radiating to hands intermit x 1 mnth No imaging noted Requesting flu vaccine Need new GI referral for Colonoscopy screening Spanish interpretor present

## 2013-11-21 NOTE — Patient Instructions (Signed)
Artritis inespecífica °(Arthritis, Nonspecific) °La artritis es la inflamación de una articulación. Los síntomas son dolor, enrojecimiento, calor o hinchazón. Pueden verse involucradas una o más articulaciones. Hay diferentes tipos de artritis. El médico no podrá diagnosticar inmediatamente cuál es el tipo de artritis que usted sufre.  °CAUSAS °La causa más frecuente es el desgaste de la articulación (osteoartritis). Esto ocasiona lesiones en el cartílago, que puede romperse con el tiempo. Las zonas más afectadas por este tipo de artritis son las rodillas, caderas, espalda y cuello. °Otros tipos de artritis y causas frecuentes de dolor en la articulación son: °· Esguinces y otras lesiones cercanas a la articulación}. En algunos casos, esguinces y lesiones menores causan dolor e hinchazón que aparece horas más tarde. °· Artritis reumatoidea Afecta las manos, pies y rodillas. Generalmente afecta ambos lados del cuerpo al mismo tiempo. Generalmente se asocia a enfermedades crónicas, fiebre, pérdida de peso y debilidad general. °· Artritis por cristales. La gota y la pseudogota pueden causar dolor intenso agudo ocasional, enrojecimiento e hinchazón del pie, el tobillo o la rodilla. °· Artritis infecciosa. Las bacterias pueden penetrar en la articulación a través de una herida en la piel. Esto puede causar una infección en la articulación. Las bacterias y virus también pueden diseminarse a través del torrente sanguíneo y afectar las articulaciones. °· Reacciones a medicamentos, infecciosas y alérgicas. En algunos casos las articulaciones duelen levemente y están ligeramente hinchadas en este tipo de enfermedad. °SÍNTOMAS °· El dolor es el síntoma principal. °· La articulación también pueden verse roja, hinchada y caliente al tacto. °· En ciertos tipos de artritis hay fiebre o malestar general. °· En la articulación que presenta artritis sentirá dolor con el movimiento. En otros tipos de artritis hay  rigidez. °DIAGNÓSTICO: °El médico sospechará artritis basándose en la descripción de los síntomas y en el examen. Será necesario realizar pruebas para diagnosticar el tipo de artritis. °· Análisis de sangre y en algunos casos de orina. °· Radiografías y en algunos casos tomografía computada o diagnóstico por imágenes. °· La remoción del líquido de la articulación (artrocentesis) se realiza para controlar la presencia de bacterias, cristales o por otras causas. Su médico (o un especialista) adormecerán la zona de la articulación con un anestésico local y utilizarán una aguja para retirar líquido de la articulación para ser examinado. Este procedimiento es sólo mínimamente molesto. °· Aún con estas pruebas, el médico no podrá decir qué tipo de artritis usted sufre. La consulta con un especialista (reumatólogo) puede ser de utilidad. °TRATAMIENTO °El médico comentará con usted el tratamiento específico para su tipo de artritis. Si el tipo específico no puede determinarse, podrán aplicarse las siguientes recomendaciones generales.  °El tratamiento para el dolor intenso de las articulaciones consiste en: °· Hacer reposo °· Elevar el miembro. °· Podrán prescribirle medicamentos antiinflamatorios (como ibuprofeno). Evite las actividades que aumenten el dolor. °· Sólo tome medicamentos de venta libre o prescriptos para calmar el dolor y las molestias, según las indicaciones de su médico. °· Puede aplicarse compresas frías sobre la articulación dolorida durante 10 a 15 minutos cada hora. Las compresas calientes también pueden ser beneficiosas, pero no las utilice durante la noche. No use compresas calientes sin autorización de su médico, si es diabético. °· Una inyección de corticoides en la articulación artrítica puede ayudar a reducir el dolor y la hinchazón. °· Si una artritis aguda empeora en los siguientes 1 ó 2 días, será necesario descartar una infección. °El tratamiento prolongado implica la modificación de  actividades y del estilo   de vida para reducir el estrés en la articulación. Puede ser necesario que baje de peso. La actividad física es necesaria para nutrir el cartílago de la articulación y eliminar los desechos. Esto ayuda a mantener fuertes los músculos que rodean la articulación. °INSTRUCCIONES PARA EL CUIDADO DOMICILIARIO °· No tome aspirina para aliviar el dolor si se sospecha que sufre gota. Esto eleva los niveles de ácido úrico. °· Solo tome medicamentos que se pueden comprar sin receta o recetados para el dolor, malestar o fiebre, como le indica el médico. °· Haga reposo todo el tiempo que pueda. °· Si la articulación está hinchada, manténgala elevada. °· Utilice muletas si la articulación que le duele está en la pierna. °· Beber abundante cantidad de líquidos será beneficioso para ciertos tipos de artritis. °· Siga las indicaciones del profesional. °· La actividad física regular puede ser beneficiosa, incluyendo las actividades de bajo impacto como: °¨ Natación. °¨ Aquagym. °¨ Andar en bicicleta. °¨ Caminar. °· La rigidez matutina se alivia con una ducha caliente. °· También es beneficioso que realice ejercicios de amplitud de movimiento. °SOLICITE ATENCIÓN MÉDICA SI: °· No se siente mejor o empeora luego de las 24 horas. °· Presenta efectos adversos por los medicamentos y no mejora con el tratamiento. °SOLICITE ATENCIÓN MÉDICA INMEDIATAMENTE SI: °· Tiene fiebre. °· Presenta fiebre o dolor intenso, hinchazón o enrojecimiento. °· Muchas articulaciones están involucradas y están hinchadas y siente dolor. °· Tiene un dolor intenso en la espalda o siente debilidad en las piernas. °· Pierde el control de la vejiga o del intestino. °Document Released: 02/24/2005 Document Revised: 05/19/2011 °ExitCare® Patient Information ©2015 [employer]. This information is not intended to replace advice given to you by your health care provider. Make sure you discuss any questions you have with your health care  provider. ° °

## 2013-11-21 NOTE — Progress Notes (Signed)
Patient ID: Veronica Hart, female   DOB: Dec 10, 1955, 58 y.o.   MRN: [id number]   Veronica Hart, is a 58 y.o. female  [id number]  [id number]  DOB - 1955/12/14  Chief Complaint  Patient presents with  . Follow-up  . Knee Pain        Subjective:   Veronica Hart is a 58 y.o. female here today for a follow up visit for arthritis and allergies.  PMH includes pre-diabetes, arthritis, and seasonal allergies.  The arthritic pain is chronic in nature, located in bilateral knees, wrists, and fingers.  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  Naproxen relieves the pain.  She has tried Tylenol without relief.   She is also complaining of clear rhinorrhea.  She was previously prescribed Claritin, which relieved her symptoms.  She reports recent thinning of scalp hair.  She denies headache,chest pain, abdominal pain, nausea,new weakness tingling or numbness, cough, and SOB.  No problems updated.  ALLERGIES: No Known Allergies  PAST MEDICAL HISTORY: Past Medical History  Diagnosis Date  . Arthritis     MEDICATIONS AT HOME: Prior to Admission medications   Medication Sig Start Date End Date Taking? Authorizing Provider  ibuprofen (ADVIL,MOTRIN) 400 MG tablet Take 2 tablets (800 mg total) by mouth every 8 (eight) hours as needed for mild pain. 01/11/13   Shanker Kristeen Mans, MD  loratadine (CLARITIN) 10 MG tablet Take 1 tablet (10 mg total) by mouth daily. 11/21/13   Tresa Garter, MD  naproxen (NAPROSYN) 500 MG tablet Take 1 tablet (500 mg total) by mouth 2 (two) times daily with a meal. 11/21/13   Tresa Garter, MD     Objective:   Filed Vitals:   11/21/13 1717  BP: 136/77  Pulse: 72  Temp: 98.6 F (37 C)  TempSrc: Oral  Resp: 18  Height: 4\' 10"  (1.473 m)  Weight: 142 lb (64.411 kg)  SpO2: 99%    Exam General appearance : Awake, alert, not in any distress. Speech Clear. Not toxic looking HEENT: Atraumatic and  Normocephalic, pupils equally reactive to light and accomodation Neck: supple, no JVD. No cervical lymphadenopathy.  Chest:Good air entry bilaterally, no added sounds  CVS: S1 S2 regular, no murmurs.  Abdomen: Bowel sounds present, Non tender and not distended with no gaurding, rigidity or rebound. Extremities: B/L Lower Ext shows no edema, both legs are warm to touch Neurology: Awake alert, and oriented X 3, CN II-XII intact, Non focal Skin:No Rash Wounds:N/A  Data Review Lab Results  Component Value Date   HGBA1C 5.9 11/21/2013   HGBA1C 5.7 05/17/2013   HGBA1C 6.3* 01/17/2013     Assessment & Plan   1. Prediabetes Plan: - HgB A1c - ANA - Lipid panel Continue with lifestyle modifications (diabetic diet, exercise)  2. Arthritis  - naproxen (NAPROSYN) 500 MG tablet; Take 1 tablet (500 mg total) by mouth 2 (two) times daily with a meal.  Dispense: 60 tablet; Refill: 1 The patient was instructed to wear bilateral wrist splints which can be purchased at medical supply store at night while sleeping.   3. Allergy, subsequent encounter  - loratadine (CLARITIN) 10 MG tablet; Take 1 tablet (10 mg total) by mouth daily.  Dispense: 30 tablet; Refill: 3  4. Colon cancer screening  - HM COLONOSCOPY - Ambulatory referral to Gastroenterology  Return in about 6 months (around 05/22/2014), or if symptoms worsen or fail to improve, for Follow up Pain and comorbidities.  The  patient was given clear instructions to go to ER or return to medical center if symptoms don't improve, worsen or new problems develop. The patient verbalized understanding. The patient was told to call to get lab results if they haven't heard anything in the next week.   This note has been created with Surveyor, quantity. Any transcriptional errors are unintentional.    Gabriel Cirri- FNP student 11/21/2013 5:51 PM Evaluation and management procedures were performed  by the Advanced Practitioner under my supervision and collaboration. I have reviewed the Advanced Practitioner's note and chart, and I agree with the management and plan.   Angelica Chessman, MD, [place], [place]arilyn Cota [facility] and [facility] [place], [place]

## 2013-11-22 LAB — ANA: Anti Nuclear Antibody(ANA): NEGATIVE

## 2013-11-22 LAB — LIPID PANEL
CHOLESTEROL: 170 mg/dL (ref 0–200)
HDL: 48 mg/dL (ref >39)
LDL CALC: 98 mg/dL (ref 0–99)
Total CHOL/HDL Ratio: 3.5 Ratio
Triglycerides: 120 mg/dL (ref <150)
VLDL: 24 mg/dL (ref 0–40)

## 2013-12-28 ENCOUNTER — Telehealth: Payer: Self-pay | Admitting: Internal Medicine

## 2014-01-09 ENCOUNTER — Encounter: Payer: Self-pay | Admitting: Internal Medicine

## 2014-01-17 ENCOUNTER — Ambulatory Visit (AMBULATORY_SURGERY_CENTER): Payer: Self-pay | Admitting: *Deleted

## 2014-01-17 VITALS — Ht 58.5 in | Wt 143.0 lb

## 2014-01-17 DIAGNOSIS — Z1211 Encounter for screening for malignant neoplasm of colon: Secondary | ICD-10-CM

## 2014-01-17 MED ORDER — MOVIPREP 100 G PO SOLR: 1.0000 | Freq: Once | ORAL | Status: DC

## 2014-01-17 NOTE — Progress Notes (Signed)
No egg or soy allergy. No anesthesia problems.  No home O2.  No diet meds.  

## 2014-01-26 ENCOUNTER — Telehealth: Payer: Self-pay | Admitting: Internal Medicine

## 2014-02-07 ENCOUNTER — Ambulatory Visit: Payer: Self-pay | Admitting: Internal Medicine

## 2014-02-07 ENCOUNTER — Encounter: Payer: Self-pay | Admitting: Internal Medicine

## 2014-02-07 ENCOUNTER — Ambulatory Visit (AMBULATORY_SURGERY_CENTER): Payer: Self-pay | Admitting: Internal Medicine

## 2014-02-07 VITALS — BP 123/80 | HR 63 | Resp 17

## 2014-02-07 VITALS — BP 154/81 | HR 71 | Temp 96.7°F | Ht 58.5 in | Wt 143.0 lb

## 2014-02-07 DIAGNOSIS — Z1211 Encounter for screening for malignant neoplasm of colon: Secondary | ICD-10-CM

## 2014-02-07 DIAGNOSIS — K635 Polyp of colon: Secondary | ICD-10-CM

## 2014-02-07 DIAGNOSIS — D125 Benign neoplasm of sigmoid colon: Secondary | ICD-10-CM

## 2014-02-07 MED ORDER — SODIUM CHLORIDE 0.9 % IV SOLN: 500.0000 mL | INTRAVENOUS | Status: DC

## 2014-02-07 NOTE — Progress Notes (Signed)
Called to room to assist during endoscopic procedure.  Patient ID and intended procedure confirmed with present staff. Received instructions for my participation in the procedure from the performing physician.  

## 2014-02-07 NOTE — Progress Notes (Signed)
After visit summary and polyp given in Spanish and reviewed with interpreter.

## 2014-02-07 NOTE — Op Note (Signed)
[facility]  [address] [place], [id number]   COLONOSCOPY PROCEDURE REPORT  PATIENT: Veronica Hart, Veronica Hart  MR#: [id number] BIRTHDATE: 28-Dec-1955 , 34  yrs. old GENDER: female ENDOSCOPIST: Eustace Quail, MD REFERRED IO:[id number] Doreene Burke, M.D. PROCEDURE DATE:  02/07/2014 PROCEDURE:   Colonoscopy with snare polypectomy x 1 First Screening Colonoscopy - Avg.  risk and is 50 yrs.  old or older Yes.  Prior Negative Screening - Now for repeat screening. N/A  History of Adenoma - Now for follow-up colonoscopy & has been > or = to 3 yrs.  N/A  Polyps Removed Today? Yes. ASA CLASS:   Class II INDICATIONS:average risk for colorectal cancer. MEDICATIONS: Monitored anesthesia care and Propofol 200 mg IV  DESCRIPTION OF PROCEDURE:   After the risks benefits and alternatives of the procedure were thoroughly explained, informed consent was obtained.  The digital rectal exam revealed no abnormalities of the rectum.   The LB [id number]  endoscope was introduced through the anus and advanced to the cecum, which was identified by both the appendix and ileocecal valve. No adverse events experienced.   The quality of the prep was excellent, using MoviPrep  The instrument was then slowly withdrawn as the colon was fully examined.      COLON FINDINGS: A single polyp measuring 2 mm in size was found in the sigmoid colon.  A polypectomy was performed with a cold snare. The resection was complete, the polyp tissue was completely retrieved and sent to histology.   The examination was otherwise normal.  Retroflexed views revealed no abnormalities. The time to cecum=1 minutes 51 seconds.  Withdrawal time=9 minutes 21 seconds. The scope was withdrawn and the procedure completed. COMPLICATIONS: There were no immediate complications.  ENDOSCOPIC IMPRESSION: 1.   Single polyp measuring 2 mm in size was found in the sigmoid colon; polypectomy was performed with a cold snare 2.    The examination was otherwise normal  RECOMMENDATIONS: 1. Repeat colonoscopy in 5 years if polyp adenomatous; otherwise 10 years  eSigned:  Eustace Quail, MD 02/07/2014 2:08 PM   cc: The Patient   ; Celene Squibb, MD

## 2014-02-07 NOTE — Progress Notes (Signed)
Procedure ends, to recovery, report given and VSS. 

## 2014-02-08 ENCOUNTER — Telehealth: Payer: Self-pay

## 2014-02-08 NOTE — Telephone Encounter (Signed)
  Follow up Call-  Call back number 02/07/2014  Post procedure Call Back phone  # [phone]  Permission to leave phone message Yes     Patient questions:  Do you have a fever, pain , or abdominal swelling? No. Pain Score  0 *  Have you tolerated food without any problems? Yes.    Have you been able to return to your normal activities? Yes.    Do you have any questions about your discharge instructions: Diet   No. Medications  No. Follow up visit  No.  Do you have questions or concerns about your Care? No.  Actions: * If pain score is 4 or above: No action needed, pain <4.

## 2014-02-13 ENCOUNTER — Encounter: Payer: Self-pay | Admitting: Internal Medicine

## 2014-03-20 NOTE — Telephone Encounter (Signed)
Done

## 2014-06-12 ENCOUNTER — Ambulatory Visit: Payer: Self-pay | Attending: Internal Medicine

## 2014-10-17 ENCOUNTER — Telehealth: Payer: Self-pay | Admitting: Internal Medicine

## 2014-10-17 NOTE — Telephone Encounter (Signed)
Patient called to request a med refill for naproxen (NAPROSYN) 500 MG tablet. Patient is currently waiting for an appointment with her PCP and would like refills to last.

## 2014-12-04 ENCOUNTER — Ambulatory Visit: Payer: Self-pay | Attending: Internal Medicine

## 2015-01-22 ENCOUNTER — Encounter: Payer: Self-pay | Admitting: Family Medicine

## 2015-01-22 ENCOUNTER — Ambulatory Visit: Payer: Self-pay | Attending: Family Medicine | Admitting: Family Medicine

## 2015-01-22 VITALS — BP 133/80 | HR 82 | Temp 98.1°F | Resp 16 | Ht <= 58 in | Wt 140.0 lb

## 2015-01-22 DIAGNOSIS — J302 Other seasonal allergic rhinitis: Secondary | ICD-10-CM | POA: Insufficient documentation

## 2015-01-22 DIAGNOSIS — M1990 Unspecified osteoarthritis, unspecified site: Secondary | ICD-10-CM | POA: Insufficient documentation

## 2015-01-22 DIAGNOSIS — Z23 Encounter for immunization: Secondary | ICD-10-CM | POA: Insufficient documentation

## 2015-01-22 DIAGNOSIS — Z0000 Encounter for general adult medical examination without abnormal findings: Secondary | ICD-10-CM

## 2015-01-22 MED ORDER — NAPROXEN 500 MG PO TABS: 500.0000 mg | ORAL_TABLET | Freq: Two times a day (BID) | ORAL | Status: DC

## 2015-01-22 MED ORDER — ADJUSTABLE WRIST BRACE MISC: 1.0000 | Freq: Every day | Status: DC

## 2015-01-22 MED ORDER — FLUTICASONE PROPIONATE 50 MCG/ACT NA SUSP: 2.0000 | Freq: Every day | NASAL | Status: DC

## 2015-01-22 MED ORDER — CETIRIZINE HCL 10 MG PO TABS: 10.0000 mg | ORAL_TABLET | Freq: Every day | ORAL | Status: DC

## 2015-01-22 NOTE — Progress Notes (Signed)
Patient ID: Veronica Hart, female   DOB: May 11, 1955, 59 y.o.   MRN: [id number]   Subjective:  Patient ID: Veronica Hart, female    DOB: 01/14/1956  Age: 59 y.o. MRN: [id number] Spanish interpreter used  CC: Arthritis and Allergic Rhinitis    HPI Veronica Hart presents for    1. Joint pain: in hands, wrist, knees, back between shoulders. Has had arthritis for many years. Taking aleve arthritis. Which helps a little. Taking naproxen in the past helped more. Worse pain is in wrist, fingers and back. Knees are just starting to hurt.   2. Allergies: taking claritin. It is not working well. She is sneezing and having runny nose. No fever, chills, or headache.   Social History  Substance Use Topics  . Smoking status: Never Smoker   . Smokeless tobacco: Never Used  . Alcohol Use: No    Outpatient Prescriptions Prior to Visit  Medication Sig Dispense Refill  . ibuprofen (ADVIL,MOTRIN) 400 MG tablet Take 2 tablets (800 mg total) by mouth every 8 (eight) hours as needed for mild pain. 30 tablet 0  . loratadine (CLARITIN) 10 MG tablet Take 1 tablet (10 mg total) by mouth daily. 30 tablet 3  . naproxen (NAPROSYN) 500 MG tablet Take 1 tablet (500 mg total) by mouth 2 (two) times daily with a meal. (Patient not taking: Reported on 01/22/2015) 60 tablet 1   No facility-administered medications prior to visit.    ROS Review of Systems  Constitutional: Negative for fever and chills.  HENT: Positive for rhinorrhea and sneezing.   Eyes: Negative for visual disturbance.  Respiratory: Negative for shortness of breath.   Cardiovascular: Negative for chest pain.  Gastrointestinal: Negative for abdominal pain and blood in stool.  Musculoskeletal: Positive for arthralgias. Negative for back pain.  Skin: Negative for rash.  Allergic/Immunologic: Negative for immunocompromised state.  Hematological: Negative for adenopathy. Does not bruise/bleed easily.  Psychiatric/Behavioral:  Negative for suicidal ideas and dysphoric mood.    Objective:  BP 133/80 mmHg  Pulse 82  Temp(Src) 98.1 F (36.7 C) (Oral)  Resp 16  Ht 4\' 10"  (1.473 m)  Wt 140 lb (63.504 kg)  BMI 29.27 kg/m2  SpO2 97%  BP/Weight 01/22/2015 02/07/2014 0000000  Systolic BP [id number]  Diastolic BP 80 80 81  Wt. (Lbs) 140 - 143  BMI 29.27 - 29.37   Physical Exam  Constitutional: She is oriented to person, place, and time. She appears well-developed and well-nourished. No distress.  HENT:  Head: Normocephalic and atraumatic.  Nose: Mucosal edema present.  Cardiovascular: Normal rate, regular rhythm, normal heart sounds and intact distal pulses.   Pulmonary/Chest: Effort normal and breath sounds normal.  Musculoskeletal: She exhibits no edema.       Right wrist: She exhibits normal range of motion, no tenderness, no bony tenderness and no swelling.       Left wrist: She exhibits normal range of motion, no tenderness, no bony tenderness and no swelling.       Right knee: Normal.       Left knee: Normal.  Neurological: She is alert and oriented to person, place, and time.  Skin: Skin is warm and dry. No rash noted.  Psychiatric: She has a normal mood and affect.    Assessment & Plan:   Problem List Items Addressed This Visit    Allergic rhinitis    A: allergic rhinitis P: Zyrtec flonase       Relevant Medications   cetirizine (ZYRTEC) 10  MG tablet   fluticasone (FLONASE) 50 MCG/ACT nasal spray   Arthritis    A: arthritis in multiple joints w.o deformity or significant swelling P: Naproxen  Brace prn      Relevant Medications   naproxen (NAPROSYN) 500 MG tablet   Elastic Bandages & Supports (ADJUSTABLE WRIST BRACE) MISC    Other Visit Diagnoses    Healthcare maintenance    -  Primary    Relevant Orders    Flu Vaccine QUAD 36+ mos IM (Completed)       No orders of the defined types were placed in this encounter.    Follow-up: No Follow-up on file.   Boykin Nearing  MD

## 2015-01-22 NOTE — Patient Instructions (Addendum)
Veronica Hart was seen today for arthritis.  Diagnoses and all orders for this visit:  Healthcare maintenance -     Flu Vaccine QUAD 36+ mos IM  Arthritis -     Cancel: DG Knee Bilateral Standing AP; Future -     naproxen (NAPROSYN) 500 MG tablet; Take 1 tablet (500 mg total) by mouth 2 (two) times daily with a meal. -     Elastic Bandages & Supports (ADJUSTABLE WRIST BRACE) MISC; 1 each by Does not apply route daily. Wear for 12 hours daily  Allergy, subsequent encounter -     cetirizine (ZYRTEC) 10 MG tablet; Take 1 tablet (10 mg total) by mouth daily. -     fluticasone (FLONASE) 50 MCG/ACT nasal spray; Place 2 sprays into both nostrils daily.    F/u in 3 months for joint pain, sooner if needed  Dr. Adrian Blackwater

## 2015-01-22 NOTE — Progress Notes (Signed)
F/U arthritis  Allergie medication not helping, running nose and sneezing Pain scale #7 No suicide thought in the past two weeks  No tobacco user

## 2015-01-22 NOTE — Assessment & Plan Note (Signed)
A: arthritis in multiple joints w.o deformity or significant swelling P: Naproxen  Brace prn

## 2015-01-22 NOTE — Assessment & Plan Note (Signed)
A: allergic rhinitis P: Zyrtec flonase

## 2015-04-10 MED FILL — ?CETIRIZINE HCL 10 MG TABLE: 10 | 30 days supply | Qty: 30 | Fill #2

## 2015-04-10 MED FILL — FLUTICASONE PROP 50 MCG SPR: 50 | 30 days supply | Qty: 16 | Fill #2

## 2015-06-04 MED FILL — ?CETIRIZINE HCL 10 MG TABLE: 10 | 30 days supply | Qty: 30 | Fill #3

## 2015-07-11 MED FILL — ?CETIRIZINE HCL 10 MG TABLE: 10 | 30 days supply | Qty: 30 | Fill #4

## 2015-07-16 ENCOUNTER — Ambulatory Visit: Payer: Self-pay | Attending: Family Medicine

## 2015-09-10 MED FILL — ?CETIRIZINE HCL 10 MG TABLE: 10 | 30 days supply | Qty: 30 | Fill #5

## 2016-02-25 ENCOUNTER — Ambulatory Visit: Payer: Self-pay | Attending: Internal Medicine

## 2016-05-05 ENCOUNTER — Encounter: Payer: Self-pay | Admitting: Family Medicine

## 2016-05-05 ENCOUNTER — Ambulatory Visit: Payer: Self-pay | Attending: Family Medicine | Admitting: Family Medicine

## 2016-05-05 VITALS — BP 127/81 | HR 75 | Temp 97.7°F | Ht <= 58 in | Wt 136.6 lb

## 2016-05-05 DIAGNOSIS — M25552 Pain in left hip: Secondary | ICD-10-CM | POA: Insufficient documentation

## 2016-05-05 DIAGNOSIS — M549 Dorsalgia, unspecified: Secondary | ICD-10-CM | POA: Insufficient documentation

## 2016-05-05 DIAGNOSIS — M1990 Unspecified osteoarthritis, unspecified site: Secondary | ICD-10-CM

## 2016-05-05 DIAGNOSIS — R768 Other specified abnormal immunological findings in serum: Secondary | ICD-10-CM

## 2016-05-05 DIAGNOSIS — M1612 Unilateral primary osteoarthritis, left hip: Secondary | ICD-10-CM | POA: Insufficient documentation

## 2016-05-05 DIAGNOSIS — Z114 Encounter for screening for human immunodeficiency virus [HIV]: Secondary | ICD-10-CM

## 2016-05-05 DIAGNOSIS — Z0001 Encounter for general adult medical examination with abnormal findings: Secondary | ICD-10-CM | POA: Insufficient documentation

## 2016-05-05 DIAGNOSIS — Z9109 Other allergy status, other than to drugs and biological substances: Secondary | ICD-10-CM

## 2016-05-05 DIAGNOSIS — Z79899 Other long term (current) drug therapy: Secondary | ICD-10-CM | POA: Insufficient documentation

## 2016-05-05 LAB — CBC
HEMATOCRIT: 40.9 % (ref 35.0–45.0)
HEMOGLOBIN: 13.6 g/dL (ref 11.7–15.5)
MCH: 29.8 pg (ref 27.0–33.0)
MCHC: 33.3 g/dL (ref 32.0–36.0)
MCV: 89.7 fL (ref 80.0–100.0)
MPV: 10.3 fL (ref 7.5–12.5)
Platelets: 281 10*3/uL (ref 140–400)
RBC: 4.56 MIL/uL (ref 3.80–5.10)
RDW: 13.5 % (ref 11.0–15.0)
WBC: 5.7 10*3/uL (ref 3.8–10.8)

## 2016-05-05 LAB — URIC ACID: Uric Acid, Serum: 5.9 mg/dL (ref 2.5–7.0)

## 2016-05-05 MED ORDER — FEXOFENADINE HCL 180 MG PO TABS: 180.0000 mg | ORAL_TABLET | Freq: Every day | ORAL | 2 refills | Status: DC

## 2016-05-05 MED ORDER — NAPROXEN 500 MG PO TABS: 500.0000 mg | ORAL_TABLET | Freq: Two times a day (BID) | ORAL | 1 refills | Status: DC

## 2016-05-05 MED FILL — NAPROXEN 500 MG TABLET: 500 | 15 days supply | Qty: 30 | Fill #0

## 2016-05-05 NOTE — Progress Notes (Signed)
Pt is here today to follow up on joint pain. Pt has been having pain in left leg that starts from groin area and travel to buttocks. Pt toe on right foot is swollen.

## 2016-05-05 NOTE — Patient Instructions (Addendum)
Deserai was seen today for pain.  Diagnoses and all orders for this visit:  Arthritis of left hip -     ANA,IFA RA Diag Pnl w/rflx Tit/Patn -     Uric Acid -     CBC -     naproxen (NAPROSYN) 500 MG tablet; Take 1 tablet (500 mg total) by mouth 2 (two) times daily with a meal. -     DG HIP UNILAT WITH PELVIS 2-3 VIEWS LEFT; Future  Arthritis -     naproxen (NAPROSYN) 500 MG tablet; Take 1 tablet (500 mg total) by mouth 2 (two) times daily with a meal.  Environmental allergies -     fexofenadine (ALLEGRA ALLERGY) 180 MG tablet; Take 1 tablet (180 mg total) by mouth daily.   Take naproxen for hip pain twice daily for at least the next week. Then as needed. Go to hospital first floor radiology for x-ray of hip.  F/u in 2 weeks for lab review and pap smear   Dr. Adrian Blackwater

## 2016-05-05 NOTE — Assessment & Plan Note (Signed)
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

## 2016-05-05 NOTE — Progress Notes (Signed)
Subjective:  Patient ID: Veronica Hart, female    DOB: 18-Nov-1955  Age: 61 y.o. MRN: [id number]  CC: Pain   HPI Delta Simard presents for   1. Back pain: she has pain in her L hip and groin area. Pain with walking. Pain started one week ago. She walks on her feet all day. She has had similar pain in the past but not as severe. No trauma. No joint swelling or redness. Pain interferes with sleep. She took 600 mg ibuprofen which helped a bit. Her pain is exacerbate by rising from sitting and walking. She has hx of diffuse joint pains in the past which have improved. She has small erythematous papules on face and chest that are pruritic.   2. Swollen toe: swelling on top of R second toe. Feeling cracking in toe when she massages her foot.   Social History  Substance Use Topics  . Smoking status: Never Smoker  . Smokeless tobacco: Never Used  . Alcohol use No    Outpatient Medications Prior to Visit  Medication Sig Dispense Refill  . cetirizine (ZYRTEC) 10 MG tablet Take 1 tablet (10 mg total) by mouth daily. 30 tablet 5  . Elastic Bandages & Supports (ADJUSTABLE WRIST BRACE) MISC 1 each by Does not apply route daily. Wear for 12 hours daily (Patient not taking: Reported on 05/05/2016) 2 each 0  . fluticasone (FLONASE) 50 MCG/ACT nasal spray Place 2 sprays into both nostrils daily. (Patient not taking: Reported on 05/05/2016) 16 g 2  . naproxen (NAPROSYN) 500 MG tablet Take 1 tablet (500 mg total) by mouth 2 (two) times daily with a meal. (Patient not taking: Reported on 05/05/2016) 60 tablet 1   No facility-administered medications prior to visit.     ROS Review of Systems  Constitutional: Negative for chills and fever.  Eyes: Negative for visual disturbance.  Respiratory: Negative for shortness of breath.   Cardiovascular: Negative for chest pain.  Gastrointestinal: Negative for abdominal pain and blood in stool.  Musculoskeletal: Positive for arthralgias. Negative for  back pain.  Skin: Negative for rash.  Allergic/Immunologic: Negative for immunocompromised state.  Hematological: Negative for adenopathy. Does not bruise/bleed easily.  Psychiatric/Behavioral: Negative for dysphoric mood and suicidal ideas.    Objective:  BP 127/81 (BP Location: Left Arm, Patient Position: Sitting, Cuff Size: Small)   Pulse 75   Temp 97.7 F (36.5 C) (Oral)   Ht 4\' 10"  (1.473 m)   Wt 136 lb 9.6 oz (62 kg)   SpO2 98%   BMI 28.55 kg/m   BP/Weight 05/05/2016 01/22/2015 0000000  Systolic BP [id number]  Diastolic BP 81 80 80  Wt. (Lbs) 136.6 140 -  BMI 28.55 29.27 -    Physical Exam  Constitutional: She is oriented to person, place, and time. She appears well-developed and well-nourished. No distress.  HENT:  Head: Normocephalic and atraumatic.  Cardiovascular: Normal rate, regular rhythm, normal heart sounds and intact distal pulses.   Pulmonary/Chest: Effort normal and breath sounds normal.  Musculoskeletal: She exhibits no edema.       Left hip: She exhibits tenderness.       Legs: Neurological: She is alert and oriented to person, place, and time.  Skin: Skin is warm and dry. No rash noted.  Psychiatric: She has a normal mood and affect.     Assessment & Plan:  Marciana was seen today for pain.  Diagnoses and all orders for this visit:  Arthritis of left hip -  ANA,IFA RA Diag Pnl w/rflx Tit/Patn -     Uric Acid -     CBC -     naproxen (NAPROSYN) 500 MG tablet; Take 1 tablet (500 mg total) by mouth 2 (two) times daily with a meal. -     DG HIP UNILAT WITH PELVIS 2-3 VIEWS LEFT; Future  Arthritis -     naproxen (NAPROSYN) 500 MG tablet; Take 1 tablet (500 mg total) by mouth 2 (two) times daily with a meal.  Environmental allergies -     fexofenadine (ALLEGRA ALLERGY) 180 MG tablet; Take 1 tablet (180 mg total) by mouth daily.  Screening for HIV (human immunodeficiency virus) -     HIV antibody (with reflex)   There are no diagnoses  linked to this encounter.  No orders of the defined types were placed in this encounter.   Follow-up: Return in about 2 weeks (around 05/19/2016) for pap smear, lab review .   Boykin Nearing MD

## 2016-05-05 NOTE — Assessment & Plan Note (Signed)
Allegra  

## 2016-05-06 ENCOUNTER — Ambulatory Visit (HOSPITAL_COMMUNITY)
Admission: RE | Admit: 2016-05-06 | Discharge: 2016-05-06 | Disposition: A | Discharge status: Home / Self Care | Payer: Self-pay | Source: Ambulatory Visit | Attending: Family Medicine | Admitting: Family Medicine

## 2016-05-06 DIAGNOSIS — M1612 Unilateral primary osteoarthritis, left hip: Secondary | ICD-10-CM | POA: Insufficient documentation

## 2016-05-06 LAB — HIV ANTIBODY (ROUTINE TESTING W REFLEX): HIV: NONREACTIVE

## 2016-05-06 LAB — ANA,IFA RA DIAG PNL W/RFLX TIT/PATN
ANA: NEGATIVE
Cyclic Citrullin Peptide Ab: <16 Units
Rhuematoid fact SerPl-aCnc: 84 IU/mL — ABNORMAL HIGH (ref <14)

## 2016-05-08 DIAGNOSIS — R768 Other specified abnormal immunological findings in serum: Secondary | ICD-10-CM | POA: Insufficient documentation

## 2016-05-08 NOTE — Assessment & Plan Note (Signed)
Labs normal except for persistently elevated rheumatoid factor with negative Anti CPP antibodies, there is concern for rheumatoid arthritis given persistent and migratory nature of joint pains Continue NSAID Rheumatology referral placed

## 2016-05-08 NOTE — Addendum Note (Signed)
Addended by: Boykin Nearing on: 05/08/2016 12:56 PM   Modules accepted: Orders

## 2016-05-09 ENCOUNTER — Telehealth: Payer: Self-pay

## 2016-05-09 NOTE — Telephone Encounter (Signed)
Pt was called and interpreter stated that phone number was not working.

## 2016-05-14 ENCOUNTER — Telehealth: Payer: Self-pay

## 2016-05-14 NOTE — Telephone Encounter (Signed)
Pt was called and informed of xray results. 

## 2016-05-19 ENCOUNTER — Ambulatory Visit: Payer: Self-pay | Attending: Family Medicine | Admitting: Family Medicine

## 2016-05-19 ENCOUNTER — Encounter: Payer: Self-pay | Admitting: Family Medicine

## 2016-05-19 VITALS — BP 149/85 | HR 75 | Temp 97.7°F | Ht <= 58 in | Wt 138.8 lb

## 2016-05-19 DIAGNOSIS — Z79899 Other long term (current) drug therapy: Secondary | ICD-10-CM | POA: Insufficient documentation

## 2016-05-19 DIAGNOSIS — M25552 Pain in left hip: Secondary | ICD-10-CM | POA: Insufficient documentation

## 2016-05-19 DIAGNOSIS — Z5189 Encounter for other specified aftercare: Secondary | ICD-10-CM | POA: Insufficient documentation

## 2016-05-19 DIAGNOSIS — R7689 Other specified abnormal immunological findings in serum: Secondary | ICD-10-CM

## 2016-05-19 DIAGNOSIS — M1612 Unilateral primary osteoarthritis, left hip: Secondary | ICD-10-CM

## 2016-05-19 DIAGNOSIS — R768 Other specified abnormal immunological findings in serum: Secondary | ICD-10-CM

## 2016-05-19 MED ORDER — RANITIDINE HCL 75 MG PO TABS: 75.0000 mg | ORAL_TABLET | Freq: Two times a day (BID) | ORAL | 2 refills | Status: DC

## 2016-05-19 NOTE — Assessment & Plan Note (Signed)
Reviewed labs with patient She is aware of possible RA but picture unclear due to negative CCP She is aware of rheumatology appointment

## 2016-05-19 NOTE — Progress Notes (Signed)
   Subjective:  Patient ID: Veronica Hart, female    DOB: 04-Jul-1955  Age: 61 y.o. MRN: [id number]  CC: Follow-up   HPI Myrle Wanek presents for   1. Back pain: she has pain in her L hip and groin area. Pain with walking. Pain started one week ago. She walks on her feet all day. She has had similar pain in the past but not as severe. No trauma. No joint swelling or redness. Pain interferes with sleep. She naproxen which helped. Yesterday she developed sharp pains in left lower abdomen and L hip that radiated down her left leg. Her pain is exacerbate by rising from sitting and walking. She has hx of diffuse joint pains in the past which have improved.   Social History  Substance Use Topics  . Smoking status: Never Smoker  . Smokeless tobacco: Never Used  . Alcohol use No    Outpatient Medications Prior to Visit  Medication Sig Dispense Refill  . fexofenadine (ALLEGRA ALLERGY) 180 MG tablet Take 1 tablet (180 mg total) by mouth daily. 30 tablet 2  . naproxen (NAPROSYN) 500 MG tablet Take 1 tablet (500 mg total) by mouth 2 (two) times daily with a meal. 30 tablet 1   No facility-administered medications prior to visit.     ROS Review of Systems  Constitutional: Negative for chills and fever.  Eyes: Negative for visual disturbance.  Respiratory: Negative for shortness of breath.   Cardiovascular: Negative for chest pain.  Gastrointestinal: Negative for abdominal pain and blood in stool.  Musculoskeletal: Positive for arthralgias. Negative for back pain.  Skin: Negative for rash.  Allergic/Immunologic: Negative for immunocompromised state.  Hematological: Negative for adenopathy. Does not bruise/bleed easily.  Psychiatric/Behavioral: Negative for dysphoric mood and suicidal ideas.    Objective:  BP (!) 149/85 (BP Location: Left Arm, Patient Position: Sitting, Cuff Size: Small)   Pulse 75   Temp 97.7 F (36.5 C) (Oral)   Ht 4\' 10"  (1.473 m)   Wt 138 lb 12.8 oz (63  kg)   SpO2 98%   BMI 29.01 kg/m   BP/Weight 05/19/2016 05/05/2016 56/31/4970  Systolic BP 263 785 885  Diastolic BP 85 81 80  Wt. (Lbs) 138.8 136.6 140  BMI 29.01 28.55 29.27    Physical Exam  Constitutional: She is oriented to person, place, and time. She appears well-developed and well-nourished. No distress.  HENT:  Head: Normocephalic and atraumatic.  Cardiovascular: Normal rate, regular rhythm, normal heart sounds and intact distal pulses.   Pulmonary/Chest: Effort normal and breath sounds normal.  Musculoskeletal: She exhibits no edema.       Left hip: She exhibits tenderness.       Legs: Neurological: She is alert and oriented to person, place, and time.  Skin: Skin is warm and dry. No rash noted.  Psychiatric: She has a normal mood and affect.     Assessment & Plan:  Lorren was seen today for follow-up.  Diagnoses and all orders for this visit:  Arthritis of left hip -     ranitidine (ZANTAC 75) 75 MG tablet; Take 1 tablet (75 mg total) by mouth 2 (two) times daily.   There are no diagnoses linked to this encounter.  No orders of the defined types were placed in this encounter.   Follow-up: Return in about 4 weeks (around 06/16/2016) for pap smear .   Boykin Nearing MD

## 2016-05-19 NOTE — Assessment & Plan Note (Signed)
X-ray reviewed with patient Relatively normal Naproxen helped so will continue with zantac

## 2016-05-19 NOTE — Progress Notes (Signed)
Pt is here for a follow up. Pt states that she is not here for a pap smear. Pt is still having leg pain.

## 2016-05-19 NOTE — Patient Instructions (Addendum)
Veronica Hart was seen today for follow-up.  Diagnoses and all orders for this visit:  Arthritis of left hip -     ranitidine (ZANTAC 75) 75 MG tablet; Take 1 tablet (75 mg total) by mouth 2 (two) times daily.  continue naproxen with food You can also take tylenol in between if needed  F/u in 4 weeks for pap smear    Dr. Adrian Blackwater

## 2016-05-26 ENCOUNTER — Ambulatory Visit: Payer: Self-pay | Attending: Family Medicine

## 2016-06-09 MED FILL — NAPROXEN 500 MG TABLET: 500 | 15 days supply | Qty: 30 | Fill #1

## 2016-06-23 ENCOUNTER — Ambulatory Visit: Payer: Self-pay | Attending: Family Medicine | Admitting: Family Medicine

## 2016-06-23 ENCOUNTER — Encounter: Payer: Self-pay | Admitting: Family Medicine

## 2016-06-23 VITALS — BP 134/78 | HR 78 | Temp 98.0°F | Ht <= 58 in | Wt 138.6 lb

## 2016-06-23 DIAGNOSIS — Z9109 Other allergy status, other than to drugs and biological substances: Secondary | ICD-10-CM

## 2016-06-23 DIAGNOSIS — B9689 Other specified bacterial agents as the cause of diseases classified elsewhere: Secondary | ICD-10-CM

## 2016-06-23 DIAGNOSIS — Z124 Encounter for screening for malignant neoplasm of cervix: Secondary | ICD-10-CM

## 2016-06-23 DIAGNOSIS — Z01419 Encounter for gynecological examination (general) (routine) without abnormal findings: Secondary | ICD-10-CM | POA: Insufficient documentation

## 2016-06-23 DIAGNOSIS — N760 Acute vaginitis: Secondary | ICD-10-CM

## 2016-06-23 DIAGNOSIS — M1612 Unilateral primary osteoarthritis, left hip: Secondary | ICD-10-CM

## 2016-06-23 DIAGNOSIS — R030 Elevated blood-pressure reading, without diagnosis of hypertension: Secondary | ICD-10-CM

## 2016-06-23 MED ORDER — FEXOFENADINE HCL 180 MG PO TABS: 180.0000 mg | ORAL_TABLET | Freq: Every day | ORAL | 5 refills | Status: DC

## 2016-06-23 MED FILL — ?FEXOFENADINE HCL 180 MG TA: 180 | 30 days supply | Qty: 30 | Fill #0

## 2016-06-23 NOTE — Patient Instructions (Addendum)
Veronica Hart was seen today for gynecologic exam and leg pain.  Diagnoses and all orders for this visit:  Pap smear for cervical cancer screening -     Cytology - PAP  Arthritis of left hip  Environmental allergies -     fexofenadine (ALLEGRA ALLERGY) 180 MG tablet; Take 1 tablet (180 mg total) by mouth daily.  Elevated BP without diagnosis of hypertension -     CMP and Liver  Other orders -     Cancel: Cytology - PAP [place]   I note that your BP is elevated today and at last visit Please reduce salt in diet Labs to check on kidney function ordered  You will be called with pap results  f/u in 6 week for left hip pain and BP check   Dr. Adrian Blackwater

## 2016-06-23 NOTE — Assessment & Plan Note (Signed)
BP elevated at last 2 office visits  Patient taking naproxen for joint pains Plan: CMP check Low salt diet  Close f/u for recheck

## 2016-06-23 NOTE — Progress Notes (Addendum)
Subjective:  Patient ID: Veronica Hart, female    DOB: 09-29-1955  Age: 61 y.o. MRN: [id number]  CC: Gynecologic Exam and Leg Pain   HPI Veronica Hart presents for   Spanish interpreter used   1. Pap smear: she present for screening pap smear. She has not been sexually active for many years. She denies pelvic pain or vaginal bleeding.  2. Left hip pain: persist pain in lateral hip radiating down to knee. Pain is improved with naproxen. Pain is worse with prolonged walking or standing. She has normal x-ray of her left hip on 05/06/2016.   3. Pruritus: in arms and legs for past 2 weeks. Worse in her arms. No new skin products. 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   Social History  Substance Use Topics  . Smoking status: Never Smoker  . Smokeless tobacco: Never Used  . Alcohol use No    Outpatient Medications Prior to Visit  Medication Sig Dispense Refill  . fexofenadine (ALLEGRA ALLERGY) 180 MG tablet Take 1 tablet (180 mg total) by mouth daily. 30 tablet 2  . naproxen (NAPROSYN) 500 MG tablet Take 1 tablet (500 mg total) by mouth 2 (two) times daily with a meal. 30 tablet 1  . ranitidine (ZANTAC 75) 75 MG tablet Take 1 tablet (75 mg total) by mouth 2 (two) times daily. (Patient not taking: Reported on 06/23/2016) 60 tablet 2   No facility-administered medications prior to visit.     ROS Review of Systems  Constitutional: Negative for chills and fever.  Eyes: Negative for visual disturbance.  Respiratory: Negative for shortness of breath.   Cardiovascular: Negative for chest pain.  Gastrointestinal: Negative for abdominal pain and blood in stool.  Genitourinary: Negative for difficulty urinating, dyspareunia, dysuria, flank pain, frequency, genital sores, hematuria, pelvic pain, vaginal bleeding and vaginal discharge.  Musculoskeletal: Positive for arthralgias. Negative for back pain.  Skin: Negative for rash.   Pruritus   Allergic/Immunologic: Negative for immunocompromised state.  Hematological: Negative for adenopathy. Does not bruise/bleed easily.  Psychiatric/Behavioral: Negative for dysphoric mood and suicidal ideas.    Objective:  BP 134/78 (BP Location: Left Arm, Cuff Size: Small)   Pulse 78   Temp 98 F (36.7 C) (Oral)   Ht 4\' 10"  (1.473 m)   Wt 138 lb 9.6 oz (62.9 kg)   SpO2 98%   BMI 28.97 kg/m   BP/Weight 06/23/2016 05/19/2016 09/06/5282  Systolic BP 132 440 102  Diastolic BP 88 85 81  Wt. (Lbs) 138.6 138.8 136.6  BMI 28.97 29.01 28.55   Physical Exam  Constitutional: She appears well-developed and well-nourished. No distress.  Cardiovascular: Normal rate, regular rhythm, normal heart sounds and intact distal pulses.   Pulmonary/Chest: Effort normal and breath sounds normal.  Genitourinary: Vagina normal and uterus normal. Pelvic exam was performed with patient prone. There is no rash, tenderness or lesion on the right labia. There is no rash, tenderness or lesion on the left labia. Cervix exhibits no motion tenderness, no discharge and no friability.    Musculoskeletal: She exhibits no edema.  Lymphadenopathy:       Right: No inguinal adenopathy present.       Left: No inguinal adenopathy present.  Skin: Skin is warm and dry. No rash noted.     Assessment & Plan:  Trace was seen today for gynecologic exam and leg pain.  Diagnoses and all orders for this visit:  Pap smear for cervical cancer screening -  Cytology - PAP  Arthritis of left hip  Environmental allergies -     fexofenadine (ALLEGRA ALLERGY) 180 MG tablet; Take 1 tablet (180 mg total) by mouth daily.  Other orders -     Cancel: Cytology - PAP [place]   There are no diagnoses linked to this encounter.  No orders of the defined types were placed in this encounter.   Follow-up: No Follow-up on file.   Veronica Hart

## 2016-06-23 NOTE — Assessment & Plan Note (Signed)
Allegra ordered for pruritus

## 2016-06-23 NOTE — Assessment & Plan Note (Signed)
A: persistent pain, sciatica possible. NSAID helps P: Continue naproxen Fu in 6 wekes

## 2016-06-24 LAB — CMP AND LIVER
ALK PHOS: 91 IU/L (ref 39–117)
ALT: 19 IU/L (ref 0–32)
AST: 21 IU/L (ref 0–40)
Albumin: 4 g/dL (ref 3.5–5.5)
BILIRUBIN TOTAL: 0.3 mg/dL (ref 0.0–1.2)
BUN: 16 mg/dL (ref 6–24)
Bilirubin, Direct: 0.1 mg/dL (ref 0.00–0.40)
CO2: 27 mmol/L (ref 18–29)
CREATININE: 0.69 mg/dL (ref 0.57–1.00)
Calcium: 9.2 mg/dL (ref 8.7–10.2)
Chloride: 102 mmol/L (ref 96–106)
GFR calc Af Amer: 110 mL/min/{1.73_m2} (ref >59)
GFR, EST NON AFRICAN AMERICAN: 96 mL/min/{1.73_m2} (ref >59)
GLUCOSE: 102 mg/dL — AB (ref 65–99)
Potassium: 4.1 mmol/L (ref 3.5–5.2)
Sodium: 143 mmol/L (ref 134–144)
Total Protein: 7.3 g/dL (ref 6.0–8.5)

## 2016-06-24 LAB — CYTOLOGY - PAP
CHLAMYDIA, DNA PROBE: NEGATIVE
Diagnosis: NEGATIVE
HPV: NOT DETECTED
NEISSERIA GONORRHEA: NEGATIVE

## 2016-06-24 LAB — CERVICOVAGINAL ANCILLARY ONLY: WET PREP (BD AFFIRM): POSITIVE — AB

## 2016-06-24 MED ORDER — FLUCONAZOLE 150 MG PO TABS: 150.0000 mg | ORAL_TABLET | Freq: Once | ORAL | 0 refills | Status: AC

## 2016-06-24 MED ORDER — METRONIDAZOLE 500 MG PO TABS: 500.0000 mg | ORAL_TABLET | Freq: Two times a day (BID) | ORAL | 0 refills | Status: DC

## 2016-06-24 NOTE — Addendum Note (Signed)
Addended by: Boykin Nearing on: 06/24/2016 03:57 PM   Modules accepted: Orders

## 2016-07-02 ENCOUNTER — Telehealth: Payer: Self-pay

## 2016-07-02 NOTE — Telephone Encounter (Signed)
Pt was called and informed of lab results. 

## 2016-07-03 ENCOUNTER — Telehealth: Payer: Self-pay | Admitting: Family Medicine

## 2016-07-03 NOTE — Telephone Encounter (Signed)
PT called back, since she speak spanish can't understand the msg you left her, please call her back with someone that speak spanish @ [phone] Please follow up

## 2016-07-14 MED FILL — ?METRONIDAZOLE 500 MG TABLE: 500 | 7 days supply | Qty: 14 | Fill #0

## 2016-07-14 MED FILL — FLUCONAZOLE 150 MG TABLET: 150 | 1 days supply | Qty: 1 | Fill #0

## 2016-07-23 ENCOUNTER — Other Ambulatory Visit: Payer: Self-pay | Admitting: Family Medicine

## 2016-07-23 ENCOUNTER — Encounter: Payer: Self-pay | Admitting: Family Medicine

## 2016-07-23 DIAGNOSIS — M1612 Unilateral primary osteoarthritis, left hip: Secondary | ICD-10-CM

## 2016-07-23 DIAGNOSIS — M1990 Unspecified osteoarthritis, unspecified site: Secondary | ICD-10-CM

## 2016-07-23 MED FILL — NAPROXEN 500 MG TABLET: 500 | 15 days supply | Qty: 30 | Fill #0

## 2016-07-23 MED FILL — ?FEXOFENADINE HCL 180 MG TA: 180 | 30 days supply | Qty: 30 | Fill #1

## 2016-07-28 ENCOUNTER — Encounter: Payer: Self-pay | Admitting: Family Medicine

## 2016-07-28 ENCOUNTER — Ambulatory Visit: Payer: Self-pay | Attending: Family Medicine | Admitting: Family Medicine

## 2016-07-28 VITALS — BP 138/76 | HR 78 | Temp 98.0°F | Wt 139.6 lb

## 2016-07-28 DIAGNOSIS — Z79899 Other long term (current) drug therapy: Secondary | ICD-10-CM | POA: Insufficient documentation

## 2016-07-28 DIAGNOSIS — M25552 Pain in left hip: Secondary | ICD-10-CM | POA: Insufficient documentation

## 2016-07-28 DIAGNOSIS — G8929 Other chronic pain: Secondary | ICD-10-CM | POA: Insufficient documentation

## 2016-07-28 MED ORDER — RANITIDINE HCL 75 MG PO TABS: 75.0000 mg | ORAL_TABLET | Freq: Two times a day (BID) | ORAL | 2 refills | Status: DC

## 2016-07-28 MED ORDER — NAPROXEN 500 MG PO TABS: ORAL_TABLET | ORAL | 1 refills | Status: DC

## 2016-07-28 NOTE — Assessment & Plan Note (Signed)
Pain in hip is now chronic Patient declines stronger pain medicine Plan: Continue naproxen Ortho referral

## 2016-07-28 NOTE — Progress Notes (Signed)
Subjective:  Patient ID: Veronica Hart, female    DOB: 02-May-1955  Age: 61 y.o. MRN: [id number]  CC: Hip Pain   HPI Veronica Hart presents for   [employer] video interpreter [place] ID # [id number]   4. L hip pain:  she has pain in her L hip and groin area that started in 04/28/2016. Pains is anterior. Pain is exacerbated by standing for sitting and the initial few steps of walking. Pain improves a bit after walking for a minute or 2.  She works on her feet all day. She has had similar pain in the past but not as severe. No trauma. No joint swelling or redness. Pain interferes with sleep. She takes naproxen which helps, She has sharp pains in her hip. She also takes Her level of pain is 7/10.   Last week she developed similar pain on her R lateral hip, but it  does not radiate down her thigh or to her knee. Her pain level is 5/10.   She denies back pain or pain radiating down the back of her leg.  She has normal x-ray of her left hip on 05/06/2016.   Social History  Substance Use Topics  . Smoking status: Never Smoker  . Smokeless tobacco: Never Used  . Alcohol use No    Outpatient Medications Prior to Visit  Medication Sig Dispense Refill  . fexofenadine (ALLEGRA ALLERGY) 180 MG tablet Take 1 tablet (180 mg total) by mouth daily. 30 tablet 5  . naproxen (NAPROSYN) 500 MG tablet TAKE 1 TABLET BY MOUTH 2 TIMES DAILY WITH A MEAL. 30 tablet 1  . metroNIDAZOLE (FLAGYL) 500 MG tablet Take 1 tablet (500 mg total) by mouth 2 (two) times daily. (Patient not taking: Reported on 07/28/2016) 14 tablet 0  . ranitidine (ZANTAC 75) 75 MG tablet Take 1 tablet (75 mg total) by mouth 2 (two) times daily. (Patient not taking: Reported on 06/23/2016) 60 tablet 2   No facility-administered medications prior to visit.     ROS Review of Systems  Constitutional: Negative for chills and fever.  Eyes: Negative for visual disturbance.  Respiratory: Negative for shortness of breath.     Cardiovascular: Negative for chest pain.  Gastrointestinal: Negative for abdominal pain and blood in stool.  Musculoskeletal: Positive for arthralgias. Negative for back pain.  Skin: Negative for rash.  Allergic/Immunologic: Negative for immunocompromised state.  Hematological: Negative for adenopathy. Does not bruise/bleed easily.  Psychiatric/Behavioral: Negative for dysphoric mood and suicidal ideas.    Objective:  BP 138/76   Pulse 78   Temp 98 F (36.7 C) (Oral)   Wt 139 lb 9.6 oz (63.3 kg)   SpO2 98%   BMI 29.18 kg/m   BP/Weight 07/28/2016 06/23/2016 10/25/5629  Systolic BP 497 026 378  Diastolic BP 76 78 85  Wt. (Lbs) 139.6 138.6 138.8  BMI 29.18 28.97 29.01    Physical Exam  Constitutional: She is oriented to person, place, and time. She appears well-developed and well-nourished. No distress.  HENT:  Head: Normocephalic and atraumatic.  Cardiovascular: Normal rate, regular rhythm, normal heart sounds and intact distal pulses.   Pulmonary/Chest: Effort normal and breath sounds normal.  Musculoskeletal: She exhibits no edema.       Left hip: She exhibits tenderness.       Legs: Neurological: She is alert and oriented to person, place, and time.  Skin: Skin is warm and dry. No rash noted.  Psychiatric: She has a normal mood and affect.  Assessment & Plan:  Veronica Hart was seen today for hip pain.  Diagnoses and all orders for this visit:  Chronic left hip pain -     Ambulatory referral to Orthopedic Surgery -     naproxen (NAPROSYN) 500 MG tablet; TAKE 1 TABLET BY MOUTH 2 TIMES DAILY WITH A MEAL. -     ranitidine (ZANTAC 75) 75 MG tablet; Take 1 tablet (75 mg total) by mouth 2 (two) times daily.   There are no diagnoses linked to this encounter.  No orders of the defined types were placed in this encounter.   Follow-up: Return in about 8 weeks (around 09/22/2016) for hip pain.   Boykin Nearing MD

## 2016-07-28 NOTE — Patient Instructions (Addendum)
Veronica Hart was seen today for hip pain.  Diagnoses and all orders for this visit:  Chronic left hip pain -     Ambulatory referral to Orthopedic Surgery -     naproxen (NAPROSYN) 500 MG tablet; TAKE 1 TABLET BY MOUTH 2 TIMES DAILY WITH A MEAL. -     ranitidine (ZANTAC 75) 75 MG tablet; Take 1 tablet (75 mg total) by mouth 2 (two) times daily.    F/u in 8 weeks for hip pains  Dr. Adrian Blackwater  Vaginosis bacteriana (Bacterial Vaginosis) La vaginosis bacteriana es una infeccin de la vagina. Se produce cuando crece una cantidad excesiva de grmenes normales (bacterias sanas) en la vagina. Esta infeccin aumenta el riesgo de contraer otras infecciones de transmisin sexual. El tratamiento de esta infeccin puede ayudar a reducir el riesgo de otras infecciones, como:  Clamidia.  Veronica Hart.  VIH.  Herpes. Mango los medicamentos tal como se lo indic su mdico.  Finalice la prescripcin completa, aunque comience a sentirse mejor.  Comunique a sus compaeros sexuales que sufre una infeccin. Deben consultar a su mdico para iniciar un tratamiento.  Durante el tratamiento:  Teacher, music o use preservativos de [employer].  No se haga duchas vaginales.  No consuma alcohol a menos que el mdico lo autorice.  No amamante a menos que el mdico la autorice. SOLICITE AYUDA SI:  No mejora luego de 3 das de tratamiento.  Observa una secrecin (prdida) de color gris ms abundante que proviene de la vagina.  Siente ms dolor que antes.  Tiene fiebre. ASEGRESE DE QUE:  Comprende estas instrucciones.  Controlar su afeccin.  Recibir ayuda de inmediato si no mejora o si empeora. Esta informacin no tiene Marine scientist el consejo del mdico. Asegrese de hacerle al mdico cualquier pregunta que tenga. Document Released: 05/23/2008 Document Revised: 06/18/2015 Document Reviewed: 10/06/2012 Elsevier Interactive Patient Education   2017 [employer].

## 2016-08-11 ENCOUNTER — Ambulatory Visit (INDEPENDENT_AMBULATORY_CARE_PROVIDER_SITE_OTHER): Payer: Self-pay

## 2016-08-11 ENCOUNTER — Encounter (INDEPENDENT_AMBULATORY_CARE_PROVIDER_SITE_OTHER): Payer: Self-pay | Admitting: Orthopaedic Surgery

## 2016-08-11 ENCOUNTER — Ambulatory Visit (INDEPENDENT_AMBULATORY_CARE_PROVIDER_SITE_OTHER): Payer: Self-pay | Admitting: Orthopaedic Surgery

## 2016-08-11 DIAGNOSIS — M25552 Pain in left hip: Secondary | ICD-10-CM

## 2016-08-11 DIAGNOSIS — M25551 Pain in right hip: Secondary | ICD-10-CM

## 2016-08-11 NOTE — Progress Notes (Signed)
Office Visit Note   Patient: Veronica Hart           Date of Birth: 1955-08-14           MRN: [id number] Visit Date: 08/11/2016              Requested by: Boykin Nearing, MD [address] [place], [place] [address] PCP: Boykin Nearing, MD   Assessment & Plan: Visit Diagnoses:  1. Pain in right hip   2. 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 Recommend injections with Dr. Ernestina Patches. Questions encouraged and answered. Continue naproxen as needed. Total face to face encounter time was greater than 45 minutes and over half of this time was spent in counseling and/or coordination of care.  Follow-Up Instructions: Return if symptoms worsen or fail to improve.   Orders:  Orders Placed This Encounter  Procedures  . XR HIP UNILAT W OR W/O PELVIS 2-3 VIEWS RIGHT  . Ambulatory referral to Physical Medicine Rehab   No orders of the defined types were placed in this encounter.     Procedures: No procedures performed   Clinical Data: No additional findings.   Subjective: Chief Complaint  Patient presents with  . Left Hip - Pain  . Right Hip - Pain    Patient is a Hispanic 61 year old female comes in with bilateral hip pain. She denies back or radicular symptoms. She states the pain in her growing and deep in her hip that occasionally radiates down to the knees. Denies any numbness or tingling. This is been going on for about 4 months. She does a lot of standing at work.    Review of Systems  Constitutional: Negative.   HENT: Negative.   Eyes: Negative.   Respiratory: Negative.   Cardiovascular: Negative.   Endocrine: Negative.   Musculoskeletal: Negative.   Neurological: Negative.   Hematological: Negative.   Psychiatric/Behavioral: Negative.   All other systems reviewed and are negative.    Objective: Vital Signs: There were no vitals taken for this visit.  Physical Exam  Constitutional: She is  oriented to person, place, and time. She appears well-developed and well-nourished.  HENT:  Head: Normocephalic and atraumatic.  Eyes: EOM are normal.  Neck: Neck supple.  Pulmonary/Chest: Effort normal.  Abdominal: Soft.  Neurological: She is alert and oriented to person, place, and time.  Skin: Skin is warm. Capillary refill takes less than 2 seconds.  Psychiatric: She has a normal mood and affect. Her behavior is normal. Judgment and thought content normal.  Nursing note and vitals reviewed.   Ortho Exam Bilateral hip exam shows positive Stinchfield sign. No radicular or sciatic tension signs. Lateral hips are nontender. Back is nontender. Pain with rotation of the hip more so with external rotation Specialty Comments:  No specialty comments available.  Imaging: No results found.   PMFS History: Patient Active Problem List   Diagnosis Date Noted  . Chronic left hip pain 07/28/2016  . Elevated rheumatoid factor 05/08/2016  . Arthritis of left hip 05/05/2016  . Environmental allergies 05/05/2016  . Allergic rhinitis 11/21/2013  . Prediabetes 05/17/2013  . Language barrier, speaks Spanish only 02/21/2013  . Arthritis 02/14/2013   Past Medical History:  Diagnosis Date  . Allergy    seasonal  . Arthritis     Family History  Problem Relation Age of Onset  . Colon cancer Neg Hx   . Arthritis Mother     Past  Surgical History:  Procedure Laterality Date  . TUBAL LIGATION     Social History   Occupational History  . Not on file.   Social History Main Topics  . Smoking status: Never Smoker  . Smokeless tobacco: Never Used  . Alcohol use No  . Drug use: No  . Sexual activity: No

## 2016-08-19 ENCOUNTER — Ambulatory Visit (INDEPENDENT_AMBULATORY_CARE_PROVIDER_SITE_OTHER): Payer: Self-pay | Admitting: Physical Medicine and Rehabilitation

## 2016-08-19 ENCOUNTER — Ambulatory Visit: Payer: Self-pay | Attending: Family Medicine

## 2016-08-19 ENCOUNTER — Ambulatory Visit (INDEPENDENT_AMBULATORY_CARE_PROVIDER_SITE_OTHER): Payer: Self-pay

## 2016-08-19 DIAGNOSIS — M25552 Pain in left hip: Secondary | ICD-10-CM

## 2016-08-19 NOTE — Progress Notes (Signed)
Veronica Hart - 61 y.o. female MRN [id number]  Date of birth: 1956-01-30  Office Visit Note: Visit Date: 08/19/2016 PCP: Boykin Nearing, MD Referred by: Boykin Nearing, MD  Subjective: Chief Complaint  Patient presents with  . Left Hip - Pain   HPI: Mrs. Sosa-Martinez is a 61 year old female followed by Dr. Erlinda Hong. He requests left diagnostic intra-articular hip injection followed by right injection in a week. She's been having bilateral hip pain. She will continue to follow up with Dr. Erlinda Hong.    ROS Otherwise per HPI.  Assessment & Plan: Visit Diagnoses:  1. Pain in left hip     Plan: Findings:  Patient did have some relief during the anesthetic phase of the injection.    Meds & Orders: No orders of the defined types were placed in this encounter.   Orders Placed This Encounter  Procedures  . Large Joint Injection/Arthrocentesis  . XR C-ARM NO REPORT    Follow-up: No Follow-up on file.   Procedures: Hip intra-articular injection fluoroscopic guidance. Date/Time: 08/19/2016 1:44 PM Performed by: Magnus Sinning Authorized by: Magnus Sinning   Consent Given by:  Patient Site marked: the procedure site was marked   Timeout: prior to procedure the correct patient, procedure, and site was verified   Indications:  Pain and diagnostic evaluation Location:  Hip Site:  L hip joint Prep: patient was prepped and draped in usual sterile fashion   Needle Size:  22 G Needle Length:  3.5 inches Approach:  Anterior Ultrasound Guidance: No   Fluoroscopic Guidance: Yes   Arthrogram: No   Medications:  3 mL bupivacaine 0.5 %; 80 mg triamcinolone acetonide 40 MG/ML Aspiration Attempted: Yes   Patient tolerance:  Patient tolerated the procedure well with no immediate complications  There was excellent flow of contrast producing a partial arthrogram of the hip. The patient did have relief of symptoms during the anesthetic phase of the injection.     No notes on file    Clinical History: No specialty comments available.  She reports that she has never smoked. She has never used smokeless tobacco.   Recent Labs  05/05/16 1007  LABURIC 5.9    Objective:  VS:  HT:    WT:   BMI:     BP:   HR: bpm  TEMP: ( )  RESP:  Physical Exam  Musculoskeletal:  Patient is slow to rise from a seated position. She ambulates without aid. She does have some pain with hip rotation.    Ortho Exam Imaging: Xr C-arm No Report  Result Date: 08/19/2016 Please see Notes or Procedures tab for imaging impression.   Past Medical/Family/Surgical/Social History: Medications & Allergies reviewed per EMR Patient Active Problem List   Diagnosis Date Noted  . Chronic left hip pain 07/28/2016  . Elevated rheumatoid factor 05/08/2016  . Arthritis of left hip 05/05/2016  . Environmental allergies 05/05/2016  . Allergic rhinitis 11/21/2013  . Prediabetes 05/17/2013  . Language barrier, speaks Spanish only 02/21/2013  . Arthritis 02/14/2013   Past Medical History:  Diagnosis Date  . Allergy    seasonal  . Arthritis    Family History  Problem Relation Age of Onset  . Colon cancer Neg Hx   . Arthritis Mother    Past Surgical History:  Procedure Laterality Date  . TUBAL LIGATION     Social History   Occupational History  . Not on file.   Social History Main Topics  . Smoking status: Never Smoker  . Smokeless  tobacco: Never Used  . Alcohol use No  . Drug use: No  . Sexual activity: No

## 2016-08-19 NOTE — Patient Instructions (Signed)

## 2016-08-19 NOTE — Progress Notes (Deleted)
Left hip pain 

## 2016-08-20 ENCOUNTER — Encounter (INDEPENDENT_AMBULATORY_CARE_PROVIDER_SITE_OTHER): Payer: Self-pay | Admitting: Physical Medicine and Rehabilitation

## 2016-08-20 MED ORDER — TRIAMCINOLONE ACETONIDE 40 MG/ML IJ SUSP
80.0000 mg | INTRAMUSCULAR | Status: AC | PRN
  Administered 2016-08-19: 80 mg via INTRA_ARTICULAR

## 2016-08-20 MED ORDER — BUPIVACAINE HCL 0.5 % IJ SOLN
3.0000 mL | INTRAMUSCULAR | Status: AC | PRN
  Administered 2016-08-19: 3 mL via INTRA_ARTICULAR

## 2016-08-26 ENCOUNTER — Ambulatory Visit (INDEPENDENT_AMBULATORY_CARE_PROVIDER_SITE_OTHER): Payer: Self-pay | Admitting: Physical Medicine and Rehabilitation

## 2016-08-26 ENCOUNTER — Ambulatory Visit (INDEPENDENT_AMBULATORY_CARE_PROVIDER_SITE_OTHER): Payer: Self-pay

## 2016-08-26 ENCOUNTER — Encounter (INDEPENDENT_AMBULATORY_CARE_PROVIDER_SITE_OTHER): Payer: Self-pay | Admitting: Orthopaedic Surgery

## 2016-08-26 ENCOUNTER — Ambulatory Visit (INDEPENDENT_AMBULATORY_CARE_PROVIDER_SITE_OTHER): Payer: Self-pay | Admitting: Orthopaedic Surgery

## 2016-08-26 DIAGNOSIS — M25552 Pain in left hip: Secondary | ICD-10-CM

## 2016-08-26 DIAGNOSIS — M25551 Pain in right hip: Secondary | ICD-10-CM

## 2016-08-26 NOTE — Progress Notes (Signed)
Veronica Hart - 61 y.o. female MRN [id number]  Date of birth: [birth year]  Office Visit Note: Visit Date: 08/26/2016 PCP: Boykin Nearing, MD Referred by: Boykin Nearing, MD  Subjective: Chief Complaint  Patient presents with  . Right Hip - Pain   HPI: [name] is a 61 year old female that we completed a left intra-articular hip injection last week. For the first couple days she did extremely well for the symptoms have returned.  Pain is really in the left hip and groin radiating down the front of the thigh to the knee. She denies any real right-sided complaints. We did anticipate in right hip injection today. Encounter today was completed with the help of an interpreter over the phone. We decided to have her see Dr. Erlinda Hong today who had an opening for follow-up on her left hip. He may wish to repeat the injection at some point but she is asking [place] today about repeating an but rather her see him for evaluation.    Review of Systems  Constitutional: Negative for chills, fever, malaise/fatigue and weight loss.  HENT: Negative for hearing loss and sinus pain.   Eyes: Negative for blurred vision, double vision and photophobia.  Respiratory: Negative for cough and shortness of breath.   Cardiovascular: Negative for chest pain, palpitations and leg swelling.  Gastrointestinal: Negative for abdominal pain, nausea and vomiting.  Genitourinary: Negative for flank pain.  Musculoskeletal: Positive for joint pain. Negative for myalgias.  Skin: Negative for itching and rash.  Neurological: Negative for tremors, focal weakness and weakness.  Endo/Heme/Allergies: Negative.   Psychiatric/Behavioral: Negative for depression.  All other systems reviewed and are negative.  Otherwise per HPI.  Assessment & Plan: Visit Diagnoses:  1. Pain in right hip     Plan: Findings:  Continued left hip and groin pain consistent with hip pathology. X-rays show some arthritis but not severe. I did  run this by Dr. Erlinda Hong is going to see her this afternoon for follow-up. We'll await any recommendations from him in terms of other injections.    Meds & Orders: No orders of the defined types were placed in this encounter.  No orders of the defined types were placed in this encounter.   Follow-up: Return for Seeing Dr. Erlinda Hong today for follow-up.   Procedures: No procedures performed  No notes on file   Clinical History: No specialty comments available.  She reports that she has never smoked. She has never used smokeless tobacco.   Recent Labs  05/05/16 1007  LABURIC 5.9    Objective:  VS:  HT:    WT:   BMI:     BP:   HR: bpm  TEMP: ( )  RESP:  Physical Exam  Constitutional: She is oriented to person, place, and time. She appears well-developed and well-nourished.  Eyes: Conjunctivae and EOM are normal. Pupils are equal, round, and reactive to light.  Cardiovascular: Normal rate and intact distal pulses.   Pulmonary/Chest: Effort normal.  Musculoskeletal:  Patient has pain in the left hip with internal rotation at the end range. This is pain at the groin and referring down to the thigh. She has no pain in right hip rotation.  Neurological: She is alert and oriented to person, place, and time. She exhibits normal muscle tone.  Skin: Skin is warm and dry. No rash noted. No erythema.  Psychiatric: She has a normal mood and affect. Her behavior is normal.  Nursing note and vitals reviewed.   Ortho Exam Imaging: No  results found.  Past Medical/Family/Surgical/Social History: Medications & Allergies reviewed per EMR Patient Active Problem List   Diagnosis Date Noted  . Chronic left hip pain 07/28/2016  . Elevated rheumatoid factor 05/08/2016  . Arthritis of left hip 05/05/2016  . Environmental allergies 05/05/2016  . Allergic rhinitis 11/21/2013  . Prediabetes 05/17/2013  . Language barrier, speaks Spanish only 02/21/2013  . Arthritis 02/14/2013   Past Medical History:    Diagnosis Date  . Allergy    seasonal  . Arthritis    Family History  Problem Relation Age of Onset  . Colon cancer Neg Hx   . Arthritis Mother    Past Surgical History:  Procedure Laterality Date  . TUBAL LIGATION     Social History   Occupational History  . Not on file.   Social History Main Topics  . Smoking status: Never Smoker  . Smokeless tobacco: Never Used  . Alcohol use No  . Drug use: No  . Sexual activity: No

## 2016-08-26 NOTE — Progress Notes (Deleted)
Right hip pain. Speaks very little english and needs interpreter.

## 2016-08-26 NOTE — Progress Notes (Signed)
   Office Visit Note   Patient: Veronica Hart           Date of Birth: Feb 13, 1956           MRN: [id number] Visit Date: 08/26/2016              Requested by: Boykin Nearing, MD [address] [place], [place] [address] PCP: Boykin Nearing, MD   Assessment & Plan: Visit Diagnoses:  1. Bilateral hip pain     Plan: Patient is on her feet a lot for work. Prescription for she went service was given.. She has had partial relief from the injection. Recommend MRI of the left hip at this point. Follow-up after the MRI.  Follow-Up Instructions: Return in about 2 weeks (around 09/09/2016).   Orders:  Orders Placed This Encounter  Procedures  . MR Hip Left w/o contrast   No orders of the defined types were placed in this encounter.     Procedures: No procedures performed   Clinical Data: No additional findings.   Subjective: Chief Complaint  Patient presents with  . Left Hip - Follow-up    Veronica Hart comes back today for her left hip pain. She had an injection last week and her left hip with Dr. Ernestina Patches. She did have good relief from anesthetic. She states she still has some discomfort in her left groin. This also radiates down into her thigh.    Review of Systems  Constitutional: Negative.   HENT: Negative.   Eyes: Negative.   Respiratory: Negative.   Cardiovascular: Negative.   Endocrine: Negative.   Musculoskeletal: Negative.   Neurological: Negative.   Hematological: Negative.   Psychiatric/Behavioral: Negative.   All other systems reviewed and are negative.    Objective: Vital Signs: There were no vitals taken for this visit.  Physical Exam  Constitutional: She is oriented to person, place, and time. She appears well-developed and well-nourished.  Pulmonary/Chest: Effort normal.  Neurological: She is alert and oriented to person, place, and time.  Skin: Skin is warm. Capillary refill takes less than 2 seconds.  Psychiatric: She has a normal mood and  affect. Her behavior is normal. Judgment and thought content normal.  Nursing note and vitals reviewed.   Ortho Exam Left hip exam is stable. Specialty Comments:  No specialty comments available.  Imaging: No results found.   PMFS History: Patient Active Problem List   Diagnosis Date Noted  . Chronic left hip pain 07/28/2016  . Elevated rheumatoid factor 05/08/2016  . Arthritis of left hip 05/05/2016  . Environmental allergies 05/05/2016  . Allergic rhinitis 11/21/2013  . Prediabetes 05/17/2013  . Language barrier, speaks Spanish only 02/21/2013  . Arthritis 02/14/2013   Past Medical History:  Diagnosis Date  . Allergy    seasonal  . Arthritis     Family History  Problem Relation Age of Onset  . Colon cancer Neg Hx   . Arthritis Mother     Past Surgical History:  Procedure Laterality Date  . TUBAL LIGATION     Social History   Occupational History  . Not on file.   Social History Main Topics  . Smoking status: Never Smoker  . Smokeless tobacco: Never Used  . Alcohol use No  . Drug use: No  . Sexual activity: No

## 2016-09-01 ENCOUNTER — Ambulatory Visit (HOSPITAL_COMMUNITY)
Admission: EM | Admit: 2016-09-01 | Discharge: 2016-09-01 | Disposition: A | Discharge status: Home / Self Care | Payer: Self-pay | Attending: Family Medicine | Admitting: Family Medicine

## 2016-09-01 ENCOUNTER — Encounter (HOSPITAL_COMMUNITY): Payer: Self-pay | Admitting: Emergency Medicine

## 2016-09-01 DIAGNOSIS — H8110 Benign paroxysmal vertigo, unspecified ear: Secondary | ICD-10-CM

## 2016-09-01 MED ORDER — MECLIZINE HCL 12.5 MG PO TABS: 12.5000 mg | ORAL_TABLET | Freq: Three times a day (TID) | ORAL | 0 refills | Status: DC | PRN

## 2016-09-01 MED FILL — TRAVEL SICKNESS 25 MG TAB C: 25 | 10 days supply | Qty: 15 | Fill #0

## 2016-09-01 NOTE — ED Provider Notes (Signed)
[place]    CSN: [id number] Arrival date & time: 09/01/16  1152     History   Chief Complaint Chief Complaint  Patient presents with  . Dizziness  . Headache    HPI Ashni Lonzo is a 61 y.o. female.   The patient presented to the [facility] with a complaint of dizziness and a head fullness x 4 days. The vertigo she describes is worse when she looks up or looks down and seems to disappear when she looks straight ahead and stay still. The problem is relatively continuous however. She did have an injection in her hip 2 weeks ago for arthritis.  Patient's had no fever, stiff neck, cough, vomiting, or head trauma      Past Medical History:  Diagnosis Date  . Allergy    seasonal  . Arthritis     Patient Active Problem List   Diagnosis Date Noted  . Chronic left hip pain 07/28/2016  . Elevated rheumatoid factor 05/08/2016  . Arthritis of left hip 05/05/2016  . Environmental allergies 05/05/2016  . Allergic rhinitis 11/21/2013  . Prediabetes 05/17/2013  . Language barrier, speaks Spanish only 02/21/2013  . Arthritis 02/14/2013    Past Surgical History:  Procedure Laterality Date  . TUBAL LIGATION      OB History    Gravida Para Term Preterm AB Living   2 2 2     2    SAB TAB Ectopic Multiple Live Births                   Home Medications    Prior to Admission medications   Medication Sig Start Date End Date Taking? Authorizing Provider  meclizine (ANTIVERT) 12.5 MG tablet Take 1 tablet (12.5 mg total) by mouth 3 (three) times daily as needed for dizziness. 09/01/16   Robyn Haber, MD    Family History Family History  Problem Relation Age of Onset  . Colon cancer Neg Hx   . Arthritis Mother     Social History Social History  Substance Use Topics  . Smoking status: Never Smoker  . Smokeless tobacco: Never Used  . Alcohol use No     Allergies   Patient has no known allergies.   Review of Systems Review of Systems    Neurological: Positive for dizziness.  All other systems reviewed and are negative.    Physical Exam Triage Vital Signs ED Triage Vitals  Enc Vitals Group     BP 09/01/16 1200 (!) 170/76     Pulse Rate 09/01/16 1200 76     Resp 09/01/16 1200 18     Temp 09/01/16 1200 98.3 F (36.8 C)     Temp Source 09/01/16 1200 Oral     SpO2 09/01/16 1200 100 %     Weight --      Height --      Head Circumference --      Peak Flow --      Pain Score 09/01/16 1158 0     Pain Loc --      Pain Edu? --      Excl. in [place]? --    No data found.   Updated Vital Signs BP (!) 170/76 (BP Location: Right Arm)   Pulse 76   Temp 98.3 F (36.8 C) (Oral)   Resp 18   SpO2 100%    Physical Exam  Constitutional: She is oriented to person, place, and time. She appears well-developed and well-nourished.  HENT:  Right  Ear: External ear normal.  Left Ear: External ear normal.  Mouth/Throat: Oropharynx is clear and moist.  Well-healed white scars on both TMs  Eyes: Conjunctivae are normal. Pupils are equal, round, and reactive to light.  Neck: Normal range of motion. Neck supple.  Pulmonary/Chest: Effort normal.  Musculoskeletal: Normal range of motion.  Neurological: She is alert and oriented to person, place, and time. No cranial nerve deficit.  Skin: Skin is warm and dry.  Nursing note and vitals reviewed.    UC Treatments / Results  Labs (all labs ordered are listed, but only abnormal results are displayed) Labs Reviewed - No data to display  EKG  EKG Interpretation None       Radiology No results found.  Procedures Procedures (including critical care time)  Medications Ordered in UC Medications - No data to display   Initial Impression / Assessment and Plan / UC Course  I have reviewed the triage vital signs and the nursing notes.  Pertinent labs & imaging results that were available during my care of the patient were reviewed by me and considered in my medical decision  making (see chart for details).     Final Clinical Impressions(s) / UC Diagnoses   Final diagnoses:  Benign paroxysmal positional vertigo, unspecified laterality    New Prescriptions New Prescriptions   MECLIZINE (ANTIVERT) 12.5 MG TABLET    Take 1 tablet (12.5 mg total) by mouth 3 (three) times daily as needed for dizziness.     Robyn Haber, MD 09/01/16 1222

## 2016-09-01 NOTE — ED Triage Notes (Signed)
The patient presented to the [facility] with a complaint of dizziness and a headache x 4 days.

## 2016-09-02 ENCOUNTER — Ambulatory Visit: Payer: Self-pay

## 2016-09-08 ENCOUNTER — Other Ambulatory Visit: Payer: Self-pay

## 2016-09-15 ENCOUNTER — Encounter: Payer: Self-pay | Admitting: Family Medicine

## 2016-09-15 ENCOUNTER — Ambulatory Visit: Payer: Self-pay | Attending: Family Medicine | Admitting: Family Medicine

## 2016-09-15 ENCOUNTER — Ambulatory Visit (INDEPENDENT_AMBULATORY_CARE_PROVIDER_SITE_OTHER): Payer: Self-pay | Admitting: Orthopaedic Surgery

## 2016-09-15 VITALS — BP 132/80 | HR 75 | Temp 97.5°F | Resp 16 | Wt 140.0 lb

## 2016-09-15 DIAGNOSIS — Z1231 Encounter for screening mammogram for malignant neoplasm of breast: Secondary | ICD-10-CM

## 2016-09-15 DIAGNOSIS — H8110 Benign paroxysmal vertigo, unspecified ear: Secondary | ICD-10-CM | POA: Insufficient documentation

## 2016-09-15 DIAGNOSIS — G8929 Other chronic pain: Secondary | ICD-10-CM

## 2016-09-15 DIAGNOSIS — M25552 Pain in left hip: Secondary | ICD-10-CM | POA: Insufficient documentation

## 2016-09-15 DIAGNOSIS — Z79899 Other long term (current) drug therapy: Secondary | ICD-10-CM | POA: Insufficient documentation

## 2016-09-15 DIAGNOSIS — H8113 Benign paroxysmal vertigo, bilateral: Secondary | ICD-10-CM

## 2016-09-15 DIAGNOSIS — Z23 Encounter for immunization: Secondary | ICD-10-CM

## 2016-09-15 NOTE — Assessment & Plan Note (Signed)
Improving Home vestibular rehab exercises provided No signs or symptoms of infectious or ischemic cause

## 2016-09-15 NOTE — Patient Instructions (Addendum)
Veronica Hart was seen today for follow-up.  Diagnoses and all orders for this visit:  Benign paroxysmal positional vertigo due to bilateral vestibular disorder  Visit for screening mammogram -     MM DIGITAL SCREENING BILATERAL; Future    F/u in 3 months for hip pain sooner if needed  Dr. Tollie Eth de [place], cuidado personal (Epley Maneuver Self-Care) QU ES LA [place]? La Yahoo de Epley es un ejercicio que puede Optometrist para Public house manager los sntomas del vrtigo posicional paroxstico benigno (VPPB). Esta afeccin a menudo se conoce como vrtigo. El movimiento de unos pequeos cristales (canalculos) dentro del odo interno ocasiona el VPPB. La acumulacin y el movimiento de los canalculos en el odo interno ocasionan una repentina sensacin de aceleracin (vrtigo) cuando se mueve la cabeza en ciertas posiciones. El vrtigo por lo general dura unos 30das. El VPPB normalmente ocurre solo en un odo. Si siente vrtigo cuando se recuesta sobre el lado izquierdo, probablemente tenga VPPB en el odo izquierdo. El mdico le puede decir qu odo est involucrado. Una lesin en la cabeza puede ocasionar el VPPB. Muchas personas de ms de 50aos tienen VPPB por motivos desconocidos. Si se le diagnostic VPPB, el mdico puede ensearle a Runner, broadcasting/film/video. El VPPB no es potencialmente mortal (benigno) y normalmente se pasa con el tiempo. [place]? Puede realizar [employer] en su casa cuando tenga los sntomas de vrtigo. Puede realizar Neurosurgeon de [place] hasta 3veces en un da hasta que los sntomas de vrtigo desaparezcan. Normal DE [place]? 1. Sintese en el borde de una cama o una mesa con la espalda recta. Las piernas deben estar extendidas o colgando sobre el borde de la cama o la mesa. 2. Gire la cabeza a medias hacia el lado del odo afectado. 3. Recustese hacia atrs con la cabeza girada hasta que se  encuentre recostado sobre la espalda. Quizs quiera colocar una almohada debajo de los hombros. 4. Mantenga esta posicin durante 30segundos. Es posible que experimente un ataque de vrtigo. Esto es normal. Mantenga esta posicin hasta que el vrtigo desaparezca. 5. Luego gire la cabeza en direccin opuesta hasta que el odo no afectado est orientado al suelo. 6. Mantenga esta posicin durante 30segundos. Es posible que experimente un ataque de vrtigo. Esto es normal. Mantenga esta posicin hasta que el vrtigo desaparezca. 7. Ahora gire todo el cuerpo hacia el mismo lado que la cabeza. Mantenga esta posicin durante otros 30segundos. 8. Luego, vuelva a sentarse.  ESTA MANIOBRA PRESENTA RIESGOS? En algunos casos, puede tener otros sntomas (como cambios en la visin, debilidad o entumecimiento). Si tiene estos sntomas, deje de Statistician y llame al mdico. Memory Dance si realizar esta maniobra lo [place] del vrtigo, es posible que sienta mareos. El mareo es la sensacin de desvanecimiento pero sin la sensacin de [place]. Aunque la Yahoo de Engineer, petroleum lo [place] del vrtigo, es posible que los sntomas vuelvan durante los siguientes 5aos. QU DEBO HACER DESPUS DE ESTA [place]? Puede retomar sus actividades normales despus de Optometrist la [place] de Engineer, petroleum. Pregntele al mdico si debe hacer algo en su casa para prevenir el vrtigo. Esta puede incluir:  Dormir con dos o ms almohadas para Theatre manager la cabeza elevada.  No dormir sobre el lado del odo afectado.  Levantarse lentamente de la cama.  Evitar los movimientos repentinos Agricultural consultant.  Evitar los movimientos de cabeza intensos, como mirar hacia arriba o Office manager.  Ardell Isaacs  un collar cervical para evitar los movimientos de cabeza repentinos. [place] SI LOS SNTOMAS EMPEORAN? Llame al mdico si el vrtigo empeora. Llame al mdico inmediatamente si tiene otros sntomas,  incluidos:  Nuseas.  Vmitos.  Dolor de [place].  Debilidad.  Entumecimiento.  Cambios en la visin. Esta informacin no tiene Marine scientist el consejo del mdico. Asegrese de hacerle al mdico cualquier pregunta que tenga. Document Released: 03/01/2013 Document Revised: 03/01/2013 Document Reviewed: 01/18/2013 Elsevier Interactive Patient Education  2017 [employer].

## 2016-09-15 NOTE — Progress Notes (Signed)
Subjective:  Patient ID: Veronica Hart, female    DOB: 1955-11-06  Age: 61 y.o. MRN: [id number]  CC: Follow-up   HPI Veronica Hart presents for   Stratus video interpreter [place] ID # [id number]   9. Bilateral hip pain:  she has pain in her L hip and groin area that started in 04/28/2016. Pains is anterior. Pain is exacerbated by standing for sitting and the initial few steps of walking. Pain was not improving with oral NSAIDs and interfered with work. In 07/2016 she developed R hip pain as well. This pain was moderate, but otherwise similar to her L hip pain.  She has established with orthopaedics. She has received bilateral intraarticular injections with significant improvement in pain. She still has some pain. She is schedule for MRI of her L hip.   2. Dizziness in the morning: for the past 3 weeks. Started at work after her lunch break. Her husband is complaining of similar dizziness.  She went to urgent care on 09/01/16 she was diagnosed with benign paroxysmal positional vertigo. She was prescribed meclizine which helps.  She denies associated ringing in her ears or headache. She reports dizziness occurs when she tilts her head down.   Social History  Substance Use Topics  . Smoking status: Never Smoker  . Smokeless tobacco: Never Used  . Alcohol use No    Outpatient Medications Prior to Visit  Medication Sig Dispense Refill  . meclizine (ANTIVERT) 12.5 MG tablet Take 1 tablet (12.5 mg total) by mouth 3 (three) times daily as needed for dizziness. 30 tablet 0   No facility-administered medications prior to visit.     ROS Review of Systems  Constitutional: Negative for chills and fever.  HENT: Negative for tinnitus.   Eyes: Negative for visual disturbance.  Respiratory: Negative for shortness of breath.   Cardiovascular: Negative for chest pain.  Gastrointestinal: Negative for abdominal pain and blood in stool.  Musculoskeletal: Positive for arthralgias.  Negative for back pain.  Skin: Negative for rash.  Allergic/Immunologic: Negative for immunocompromised state.  Neurological: Positive for dizziness and numbness (in bilateral face for 2-3 months ).  Hematological: Negative for adenopathy. Does not bruise/bleed easily.  Psychiatric/Behavioral: Negative for dysphoric mood and suicidal ideas.    Objective:  BP 132/80   Pulse 75   Temp (!) 97.5 F (36.4 C) (Oral)   Resp 16   Wt 140 lb (63.5 kg)   SpO2 97%   BMI 29.26 kg/m   BP/Weight 09/15/2016 09/01/2016 07/16/3265  Systolic BP 124 580 998  Diastolic BP 80 76 76  Wt. (Lbs) 140 - 139.6  BMI 29.26 - 29.18   Orthostatic VS for the past 24 hrs:  BP- Lying Pulse- Lying BP- Sitting Pulse- Sitting BP- Standing at 0 minutes Pulse- Standing at 0 minutes  09/15/16 1150 144/84 71 (!) 137/8 71 147/78 75     Physical Exam  Constitutional: She is oriented to person, place, and time. She appears well-developed and well-nourished. No distress.  HENT:  Head: Normocephalic and atraumatic.  Right Ear: Tympanic membrane, external ear and ear canal normal.  Left Ear: Tympanic membrane, external ear and ear canal normal.  Cardiovascular: Normal rate, regular rhythm, normal heart sounds and intact distal pulses.   Pulmonary/Chest: Effort normal and breath sounds normal.  Musculoskeletal: She exhibits no edema.  Neurological: She is alert and oriented to person, place, and time. She has normal strength. No cranial nerve deficit or sensory deficit. She displays a negative Romberg sign.  Negative Dix-Hallpike Maneuver   Skin: Skin is warm and dry. No rash noted.  Psychiatric: She has a normal mood and affect.   Lab Results  Component Value Date   HGBA1C 5.9 11/21/2013     Assessment & Plan:  Ameliya was seen today for follow-up.  Diagnoses and all orders for this visit:  Benign paroxysmal positional vertigo due to bilateral vestibular disorder  Visit for screening mammogram -     MM DIGITAL  SCREENING BILATERAL; Future  Chronic left hip pain  Other orders -     Tdap vaccine greater than or equal to 7yo IM   There are no diagnoses linked to this encounter.  No orders of the defined types were placed in this encounter.   Follow-up: Return in about 3 months (around 12/16/2016) for hip pain.   Boykin Nearing MD

## 2016-09-15 NOTE — Assessment & Plan Note (Signed)
Improving Will continue orthopaedic care Patient has upcoming MRI of L hip

## 2016-09-15 NOTE — Progress Notes (Signed)
Pt states she is not in any pian Pt states she has her MRI next week Pt states since she has gotten the injection her hip is much better Pt states she doesn't have any issues  Pt states she does have some dizziness in the morning

## 2016-09-17 MED FILL — ?FEXOFENADINE HCL 180 MG TA: 180 | 30 days supply | Qty: 30 | Fill #2

## 2016-09-17 MED FILL — NAPROXEN 500 MG TABLET: 500 | 15 days supply | Qty: 30 | Fill #1

## 2016-09-18 ENCOUNTER — Other Ambulatory Visit: Payer: Self-pay | Admitting: Family Medicine

## 2016-09-18 MED ORDER — NAPROXEN 500 MG PO TABS: 500.0000 mg | ORAL_TABLET | Freq: Two times a day (BID) | ORAL | 2 refills | Status: DC

## 2016-09-22 ENCOUNTER — Ambulatory Visit
Admission: RE | Admit: 2016-09-22 | Discharge: 2016-09-22 | Disposition: A | Discharge status: Home / Self Care | Payer: No Typology Code available for payment source | Source: Ambulatory Visit | Attending: Orthopaedic Surgery | Admitting: Orthopaedic Surgery

## 2016-09-22 ENCOUNTER — Other Ambulatory Visit: Payer: Self-pay

## 2016-09-22 DIAGNOSIS — M25551 Pain in right hip: Secondary | ICD-10-CM

## 2016-09-22 DIAGNOSIS — M25552 Pain in left hip: Principal | ICD-10-CM

## 2016-09-23 ENCOUNTER — Ambulatory Visit (INDEPENDENT_AMBULATORY_CARE_PROVIDER_SITE_OTHER): Payer: Self-pay | Admitting: Orthopaedic Surgery

## 2016-09-23 ENCOUNTER — Encounter (INDEPENDENT_AMBULATORY_CARE_PROVIDER_SITE_OTHER): Payer: Self-pay | Admitting: Orthopaedic Surgery

## 2016-09-23 DIAGNOSIS — M25551 Pain in right hip: Secondary | ICD-10-CM

## 2016-09-23 DIAGNOSIS — M25552 Pain in left hip: Secondary | ICD-10-CM

## 2016-09-23 NOTE — Progress Notes (Signed)
Office Visit Note   Patient: Margert Edsall           Date of Birth: 12/29/1955           MRN: [id number] Visit Date: 09/23/2016              Requested by: Boykin Nearing, MD [address] [place], [place] [address] PCP: Boykin Nearing, MD   Assessment & Plan: Visit Diagnoses:  1. Bilateral hip pain     Plan: Patient has symptoms that are more consistent with pathology in her lumbar spine. MRI of the lumbar spine ordered. Follow-up after the study.  Follow-Up Instructions: Return in about 2 weeks (around 10/07/2016).   Orders:  Orders Placed This Encounter  Procedures  . MR Lumbar Spine w/o contrast   No orders of the defined types were placed in this encounter.     Procedures: No procedures performed   Clinical Data: No additional findings.   Subjective: Chief Complaint  Patient presents with  . Left Hip - Pain, Follow-up    Patient follows up today to review her MRI of her left hip. She states that she had 80% relief from intra-articular injection into her left hip. She is also complaining of bilateral leg pain and lateral hip pain. Denies any new numbness or tingling.    Review of Systems  Constitutional: Negative.   HENT: Negative.   Eyes: Negative.   Respiratory: Negative.   Cardiovascular: Negative.   Endocrine: Negative.   Musculoskeletal: Negative.   Neurological: Negative.   Hematological: Negative.   Psychiatric/Behavioral: Negative.   All other systems reviewed and are negative.    Objective: Vital Signs: There were no vitals taken for this visit.  Physical Exam  Constitutional: She is oriented to person, place, and time. She appears well-developed and well-nourished.  HENT:  Head: Normocephalic and atraumatic.  Eyes: EOM are normal.  Neck: Neck supple.  Pulmonary/Chest: Effort normal.  Abdominal: Soft.  Neurological: She is alert and oriented to person, place, and time.  Skin: Skin is warm. Capillary refill takes less  than 2 seconds.  Psychiatric: She has a normal mood and affect. Her behavior is normal. Judgment and thought content normal.  Nursing note and vitals reviewed.   Ortho Exam Left hip exam shows no pain with rotation of the hip. No sciatic tension signs. No focal findings. Lateral hip is slightly tender. Specialty Comments:  No specialty comments available.  Imaging: Mr Hip Left W/o Contrast  Result Date: 09/23/2016 CLINICAL DATA:  Left hip and leg pain times 4-5 months without known injury. EXAM: MR OF THE LEFT HIP WITHOUT CONTRAST TECHNIQUE: Multiplanar, multisequence MR imaging was performed. No intravenous contrast was administered. COMPARISON:  Radiographs of the pelvis and left hip from 08/11/2016 and 05/06/2016 FINDINGS: Bones: There is lower lumbar degenerative disc and facet arthropathy at L5-S1. Mild reactive edema about the SI joints may reflect mild stigmata of sacroiliitis. There is no acute fracture. No suspicious osseous lesions. Articular cartilage and labrum Articular cartilage: Thinning of the acetabular and femoral cartilage about both hips. Labrum: Anterior labral defect at its base suspicious for a labral tear, series 8 image 7 Joint or bursal effusion Joint effusion:  No joint effusion. Bursae:  No significant bursal fluid collections. Muscles and tendons Muscles and tendons: Edema adjacent and along the left greater trochanter consistent with gluteus medius tendinosis and probable mild bursitis. Other findings Miscellaneous: Intramural small fibroids noted of the uterus. Endometrium is unremarkable. Urinary bladder and adnexa are  nonacute. IMPRESSION: 1. Left greater trochanteric bursitis and mild tendinosis of the gluteus medius. 2. Findings suspicious for a tear of the left anterior acetabular labrum, series 8 image 7. 3. Mild thinning of the acetabular and femoral cartilage about both hips. 4. Mild reactive edema about the SI joints may reflect mild sacroiliitis bilaterally. 5.  Small intramural uterine fibroids. Electronically Signed   By: Ashley Royalty M.D.   On: 09/23/2016 02:42     PMFS History: Patient Active Problem List   Diagnosis Date Noted  . Benign paroxysmal positional vertigo due to bilateral vestibular disorder 09/15/2016  . Chronic left hip pain 07/28/2016  . Elevated rheumatoid factor 05/08/2016  . Arthritis of left hip 05/05/2016  . Environmental allergies 05/05/2016  . Allergic rhinitis 11/21/2013  . Prediabetes 05/17/2013  . Language barrier, speaks Spanish only 02/21/2013  . Arthritis 02/14/2013   Past Medical History:  Diagnosis Date  . Allergy    seasonal  . Arthritis     Family History  Problem Relation Age of Onset  . Colon cancer Neg Hx   . Arthritis Mother     Past Surgical History:  Procedure Laterality Date  . TUBAL LIGATION     Social History   Occupational History  . Not on file.   Social History Main Topics  . Smoking status: Never Smoker  . Smokeless tobacco: Never Used  . Alcohol use No  . Drug use: No  . Sexual activity: No

## 2016-10-06 ENCOUNTER — Ambulatory Visit
Admission: RE | Admit: 2016-10-06 | Discharge: 2016-10-06 | Disposition: A | Discharge status: Home / Self Care | Payer: No Typology Code available for payment source | Source: Ambulatory Visit | Attending: Orthopaedic Surgery | Admitting: Orthopaedic Surgery

## 2016-10-06 DIAGNOSIS — M25551 Pain in right hip: Secondary | ICD-10-CM

## 2016-10-06 DIAGNOSIS — M25552 Pain in left hip: Principal | ICD-10-CM

## 2016-10-07 ENCOUNTER — Encounter (INDEPENDENT_AMBULATORY_CARE_PROVIDER_SITE_OTHER): Payer: Self-pay | Admitting: Orthopaedic Surgery

## 2016-10-07 ENCOUNTER — Ambulatory Visit (INDEPENDENT_AMBULATORY_CARE_PROVIDER_SITE_OTHER): Payer: Self-pay | Admitting: Orthopaedic Surgery

## 2016-10-07 DIAGNOSIS — M25552 Pain in left hip: Secondary | ICD-10-CM

## 2016-10-07 NOTE — Progress Notes (Signed)
Office Visit Note   Patient: Veronica Hart           Date of Birth: [birth year]           MRN: [id number] Visit Date: 10/07/2016              Requested by: Boykin Nearing, MD [address] [place], [place] [address] PCP: Boykin Nearing, MD   Assessment & Plan: Visit Diagnoses:  1. Pain of left hip joint     Plan: Recommend epidural steroid injection with Dr. Ernestina Patches. MRI shows mild bilateral foraminal and lateral recess stenosis at L3-L4. Follow-up as needed.  Follow-Up Instructions: Return if symptoms worsen or fail to improve.   Orders:  Orders Placed This Encounter  Procedures  . Ambulatory referral to Physical Medicine Rehab   No orders of the defined types were placed in this encounter.     Procedures: No procedures performed   Clinical Data: No additional findings.   Subjective: Chief Complaint  Patient presents with  . Lower Back - Pain, Follow-up    Patient comes back today for MRI review. She denies any new symptoms.    Review of Systems   Objective: Vital Signs: There were no vitals taken for this visit.  Physical Exam  Ortho Exam Exam is stable. Specialty Comments:  No specialty comments available.  Imaging: Mr Lumbar Spine W/o Contrast  Result Date: 10/06/2016 CLINICAL DATA:  61 y/o F; lower back pain and bilateral leg pain for the past 7 months. EXAM: MRI LUMBAR SPINE WITHOUT CONTRAST TECHNIQUE: Multiplanar, multisequence MR imaging of the lumbar spine was performed. No intravenous contrast was administered. COMPARISON:  None. FINDINGS: Segmentation:  Standard. Alignment:  Physiologic. Vertebrae:  No fracture, evidence of discitis, or bone lesion. Conus medullaris: Extends to the L1 level and appears normal. Paraspinal and other soft tissues: Negative. Disc levels: L1-2: No significant disc displacement, foraminal narrowing, or canal stenosis. L2-3: No significant disc displacement, foraminal narrowing, or canal stenosis. L3-4:  Small disc bulge with mild facet and ligamentum flavum hypertrophy. Mild bilateral foraminal and lateral recess stenosis. Contact of descending L4 nerve roots in the lateral recesses. No significant canal stenosis. L4-5: Small disc bulge with mild facet hypertrophy. Mild bilateral foraminal stenosis. No significant canal stenosis. L5-S1: Small disc bulge eccentric to the right and moderate bilateral facet hypertrophy. Mild right-greater-than-left foraminal stenosis. No significant canal stenosis. IMPRESSION: 1. No acute osseous abnormality. 2. Lumbar degenerative changes greatest at the L3-4 through L5-S1 levels where disc and facet disease contributes to mild bilateral foraminal stenosis. No significant canal stenosis. 3. L3-4 lateral recess narrowing with disc contact on descending L4 nerve roots bilaterally. Electronically Signed   By: Kristine Garbe M.D.   On: 10/06/2016 23:17     PMFS History: Patient Active Problem List   Diagnosis Date Noted  . Benign paroxysmal positional vertigo due to bilateral vestibular disorder 09/15/2016  . Chronic left hip pain 07/28/2016  . Elevated rheumatoid factor 05/08/2016  . Arthritis of left hip 05/05/2016  . Environmental allergies 05/05/2016  . Allergic rhinitis 11/21/2013  . Prediabetes 05/17/2013  . Language barrier, speaks Spanish only 02/21/2013  . Arthritis 02/14/2013   Past Medical History:  Diagnosis Date  . Allergy    seasonal  . Arthritis     Family History  Problem Relation Age of Onset  . Colon cancer Neg Hx   . Arthritis Mother     Past Surgical History:  Procedure Laterality Date  . TUBAL LIGATION  Social History   Occupational History  . Not on file.   Social History Main Topics  . Smoking status: Never Smoker  . Smokeless tobacco: Never Used  . Alcohol use No  . Drug use: No  . Sexual activity: No

## 2016-10-27 ENCOUNTER — Ambulatory Visit (INDEPENDENT_AMBULATORY_CARE_PROVIDER_SITE_OTHER): Payer: Self-pay

## 2016-10-27 ENCOUNTER — Ambulatory Visit (INDEPENDENT_AMBULATORY_CARE_PROVIDER_SITE_OTHER): Payer: Self-pay | Admitting: Physical Medicine and Rehabilitation

## 2016-10-27 ENCOUNTER — Encounter (INDEPENDENT_AMBULATORY_CARE_PROVIDER_SITE_OTHER): Payer: Self-pay | Admitting: Physical Medicine and Rehabilitation

## 2016-10-27 VITALS — BP 145/83 | HR 75 | Temp 97.4°F | Wt 140.0 lb

## 2016-10-27 DIAGNOSIS — M5116 Intervertebral disc disorders with radiculopathy, lumbar region: Secondary | ICD-10-CM

## 2016-10-27 DIAGNOSIS — M48062 Spinal stenosis, lumbar region with neurogenic claudication: Secondary | ICD-10-CM

## 2016-10-27 DIAGNOSIS — M5416 Radiculopathy, lumbar region: Secondary | ICD-10-CM

## 2016-10-27 MED ORDER — BETAMETHASONE SOD PHOS & ACET 6 (3-3) MG/ML IJ SUSP
12.0000 mg | Freq: Once | INTRAMUSCULAR | Status: AC
  Administered 2016-10-27: 12 mg

## 2016-10-27 MED ORDER — LIDOCAINE HCL (PF) 1 % IJ SOLN
2.0000 mL | Freq: Once | INTRAMUSCULAR | Status: AC
  Administered 2016-10-27: 2 mL

## 2016-10-27 NOTE — Progress Notes (Deleted)
She is mostly having left leg pain, down to the calf, onset about 6 months with the pain, NKI.  No Dye Allergy, She have a driver and it is her husband.

## 2016-10-27 NOTE — Patient Instructions (Signed)

## 2016-10-27 NOTE — Procedures (Signed)
Veronica Hart is a 61 year old patient of Dr. Phoebe Sharps who comes in today with mostly left hip and leg pain to the calf. She's been living with [place] for about 6 months with no specific injury and worsening symptoms. Hip injections were not very beneficial for her. Dr. Erlinda Hong did get an MRI of the lumbar spine which is reviewed below. This shows degenerative changes particularly at L3-4 with some lateral recess narrowing bilaterally. There is no focal large disc herniations or focal stenosis. We will complete a diagnostic and hopefully therapeutic left L3-4 interlaminar epidural steroid injection. The patient does have an interpreter present today.  Lumbar Epidural Steroid Injection - Interlaminar Approach with Fluoroscopic Guidance  Patient: Veronica Hart      Date of Birth: 1955/12/18 MRN: [id number] PCP: Boykin Nearing, MD      Visit Date: 10/27/2016   Universal Protocol:     Consent Given By: the patient  Position: PRONE  Additional Comments: Vital signs were monitored before and after the procedure. Patient was prepped and draped in the usual sterile fashion. The correct patient, procedure, and site was verified.   Injection Procedure Details:  Procedure Site One Meds Administered:  Meds ordered this encounter  Medications  . lidocaine (PF) (XYLOCAINE) 1 % injection 2 mL  . betamethasone acetate-betamethasone sodium phosphate (CELESTONE) injection 12 mg     Laterality: Left  Location/Site:  L3-L4  Needle size: 20 G  Needle type: Tuohy  Needle Placement: Paramedian epidural  Findings:  -Contrast Used: 0.5 mL iohexol 180 mg iodine/mL   -Comments: Excellent flow of contrast into the epidural space.  Procedure Details: Using a paramedian approach from the side mentioned above, the region overlying the inferior lamina was localized under fluoroscopic visualization and the soft tissues overlying this structure were infiltrated with 4 ml. of 1% Lidocaine without  Epinephrine. The Tuohy needle was inserted into the epidural space using a paramedian approach.   The epidural space was localized using loss of resistance along with lateral and bi-planar fluoroscopic views.  After negative aspirate for air, blood, and CSF, a 2 ml. volume of Isovue-250 was injected into the epidural space and the flow of contrast was observed. Radiographs were obtained for documentation purposes.    The injectate was administered into the level noted above.   Additional Comments:  The patient tolerated the procedure well Dressing: Band-Aid    Post-procedure details: Patient was observed during the procedure. Post-procedure instructions were reviewed.  Patient left the clinic in stable condition.

## 2016-11-25 ENCOUNTER — Ambulatory Visit: Payer: Self-pay | Attending: Internal Medicine

## 2016-12-08 ENCOUNTER — Ambulatory Visit: Payer: Self-pay | Attending: Family Medicine | Admitting: Family Medicine

## 2016-12-08 ENCOUNTER — Encounter: Payer: Self-pay | Admitting: Family Medicine

## 2016-12-08 VITALS — BP 133/75 | HR 71 | Temp 97.8°F | Resp 18 | Ht 59.0 in | Wt 140.6 lb

## 2016-12-08 DIAGNOSIS — Z23 Encounter for immunization: Secondary | ICD-10-CM | POA: Insufficient documentation

## 2016-12-08 DIAGNOSIS — M1990 Unspecified osteoarthritis, unspecified site: Secondary | ICD-10-CM | POA: Insufficient documentation

## 2016-12-08 MED ORDER — INDOMETHACIN 25 MG PO CAPS: ORAL_CAPSULE | ORAL | 0 refills | Status: DC

## 2016-12-08 MED FILL — INDOMETHACIN 25 MG CAPSULE: 25 | 20 days supply | Qty: 40 | Fill #0

## 2016-12-08 NOTE — Progress Notes (Signed)
   Subjective:  Patient ID: Veronica Hart, female    DOB: 1956-02-01  Age: 61 y.o. MRN: [id number]  CC: Establish Care   HPI Edithe Dobbin presents for joint pain. Patient complains of arthralgias for which has been present for 6 months. Pain is located in multiple joints, (hips, knees, hands, and ankles) is described as aching, and is constant . She denies any swelling. She reports history of orthopedic referral.  The patient has tried NSAIDs and injections-which she reports helped relieved symptoms.  Related to injury:   no.        Outpatient Medications Prior to Visit  Medication Sig Dispense Refill  . meclizine (ANTIVERT) 12.5 MG tablet Take 1 tablet (12.5 mg total) by mouth 3 (three) times daily as needed for dizziness. (Patient not taking: Reported on 12/08/2016) 30 tablet 0  . naproxen (NAPROSYN) 500 MG tablet Take 1 tablet (500 mg total) by mouth 2 (two) times daily. (Patient not taking: Reported on 12/08/2016) 15 tablet 2   No facility-administered medications prior to visit.     ROS Review of Systems  Constitutional: Negative.   Respiratory: Negative.   Cardiovascular: Negative.   Musculoskeletal: Positive for arthralgias.  Skin: Negative.    Objective:  BP 133/75 (BP Location: Left Arm, Patient Position: Sitting, Cuff Size: Normal)   Pulse 71   Temp 97.8 F (36.6 C) (Oral)   Resp 18   Ht 4\' 11"  (1.499 m)   Wt 140 lb 9.6 oz (63.8 kg)   SpO2 98%   BMI 28.40 kg/m   BP/Weight 12/08/2016 7/82/9562 03/13/863  Systolic BP 784 696 295  Diastolic BP 75 83 80  Wt. (Lbs) 140.6 140 140  BMI 28.4 29.26 29.26   Physical Exam  Constitutional: She appears well-developed and well-nourished.  Eyes: Pupils are equal, round, and reactive to light. Conjunctivae are normal.  Cardiovascular: Normal rate, regular rhythm, normal heart sounds and intact distal pulses.   Pulmonary/Chest: Effort normal and breath sounds normal.  Musculoskeletal: Normal range of motion.      Left hip: She exhibits tenderness.       Right hand: She exhibits normal capillary refill. Normal strength noted.       Left hand: She exhibits normal capillary refill. Normal strength noted.  Neurological: No sensory deficit.  Skin: Skin is warm and dry.  Nursing note and vitals reviewed.    Assessment & Plan:   1. Chronic arthritis  - Rheumatoid factor - indomethacin (INDOCIN) 25 MG capsule; TAKE ONE CAPSULE BY MOUTH TWICE A DAY AS NEEDED WITH MEALS FOR PAIN.  Dispense: 40 capsule; Refill: 0  2. Needs flu shot  - Flu Vaccine QUAD 6+ mos PF IM (Fluarix Quad PF)   Meds ordered this encounter  Medications  . indomethacin (INDOCIN) 25 MG capsule    Sig: TAKE ONE CAPSULE BY MOUTH TWICE A DAY AS NEEDED WITH MEALS FOR PAIN.    Dispense:  40 capsule    Refill:  0    Order Specific Question:   Supervising Provider    Answer:   Tresa Garter [id number]    Follow-up: Return if symptoms worsen or fail to improve.   Veronica Spruce FNP

## 2016-12-08 NOTE — Patient Instructions (Signed)
Artritis (Arthritis) El trmino artritis se usa comnmente para hacer referencia al dolor de las articulaciones o a la enfermedad articular. Hay ms de 100tipos de artritis. CAUSAS La causa ms frecuente de esta afeccin es el desgaste de una articulacin. Algunas otras causas son las siguientes:  Gota.  Inflamacin de una articulacin.  Una infeccin de una articulacin.  Esguinces y otras lesiones cerca de la articulacin.  Una reaccin farmacolgica o alrgica. En algunos casos, es posible que la causa no se conozca. SNTOMAS El sntoma principal de esta afeccin es el dolor de la articulacin con el movimiento. Otros sntomas pueden ser los siguientes:  Enrojecimiento, hinchazn o rigidez de una articulacin.  Calor que emana de la articulacin.  Fiebre.  Sensacin generalizada de estar enfermo. DIAGNSTICO Esta afeccin se puede diagnosticar mediante un examen fsico y estudios, entre ellos:  Anlisis de sangre.  Anlisis de orina.  Estudios de diagnstico por imgenes, como una resonancia magntica (RM), radiografas o una tomografa computarizada (TC). A veces, se extrae lquido de una articulacin para analizarlo. TRATAMIENTO El tratamiento de esta afeccin puede incluir lo siguiente:  El tratamiento de la causa, si se conoce.  Reposo.  Mantener elevada la articulacin.  Aplicar compresas fras o calientes en la articulacin.  Medicamentos para aliviar los sntomas y reducir la inflamacin.  Inyecciones de un corticoide, como cortisona, en la articulacin para ayudar a aliviar el dolor y reducir la inflamacin. Segn la causa de la artritis, tal vez haya que hacer cambios en el estilo de vida para reducir la carga sobre la articulacin. Algunos de los cambios incluyen realizar ms actividad fsica y bajar de peso, entre otros. INSTRUCCIONES PARA EL CUIDADO EN EL HOGAR Medicamentos  Tome los medicamentos de venta libre y los recetados solamente como se lo  haya indicado el mdico.  No tome aspirina para aliviar el dolor si se sospecha la presencia de gota. Actividades  Ponga en reposo la articulacin como se lo haya indicado el mdico. El reposo es importante cuando la enfermedad est activa y la articulacin le duele, est hinchada o rgida.  Evite las actividades que intensifiquen el dolor. Es importante encontrar el equilibrio entre la actividad y el reposo.  Con frecuencia, realice ejercicios de flexibilidad articular como se lo haya indicado el mdico. Intente realizar ejercicios de bajo impacto, por ejemplo: ? Natacin. ? Gimnasia acutica. ? Andar en bicicleta. ? Caminar. Cuidado de la articulacin  Si la articulacin se le hincha, mantngala elevada como se lo haya indicado el mdico.  Si al despertar por la maana, nota que la articulacin est rgida, intente tomar una ducha con agua tibia.  Si se lo indican, pngase calor en la articulacin. Si es diabtico, no se aplique calor sin la autorizacin del mdico. ? Coloque una toalla entre la articulacin y la compresa caliente o la almohadilla trmica. ? Coloque el calor en la zona durante 20 o 30minutos.  Si se lo indican, pngase hielo en la articulacin: ? Ponga el hielo en una bolsa plstica. ? Coloque una toalla entre la piel y la bolsa de hielo. ? Coloque el hielo durante 20minutos, 2 a 3veces por da.  Concurra a todas las visitas de control como se lo haya indicado el mdico. Esto es importante. SOLICITE ATENCIN MDICA SI:  El dolor empeora.  Tiene fiebre. SOLICITE ATENCIN MDICA DE INMEDIATO SI:  Siente dolor, u observa hinchazn o enrojecimiento en la articulacin.  Siente dolor en muchas articulaciones y se le hinchan.  Siente un dolor   intenso en la espalda.  Tiene mucha debilidad en la pierna.  No puede controlar los intestinos o la vejiga. Esta informacin no tiene Marine scientist el consejo del mdico. Asegrese de hacerle al mdico cualquier  pregunta que tenga. Document Released: 02/24/2005 Document Revised: 06/18/2015 Document Reviewed: 05/22/2014 Elsevier Interactive Patient Education  2018 [employer].  Influenza Virus Vaccine injection (Fluarix) Qu es este medicamento? La VACUNA ANTIGRIPAL ayuda a disminuir el riesgo de contraer la influenza, tambin conocida como la gripe. La vacuna solo ayuda a protegerle contra algunas cepas de influenza. Esta vacuna no ayuda a reducir Catering manager de contraer influenza pandmica H1N1. Este medicamento puede ser utilizado para otros usos; si tiene alguna pregunta consulte con su proveedor de atencin mdica o con su farmacutico. MARCAS COMUNES: Fluarix, Fluzone Qu le debo informar a mi profesional de la salud antes de tomar este medicamento? Necesita saber si usted presenta alguno de los siguientes problemas o situaciones: -trastorno de sangrado como hemofilia -fiebre o infeccin -sndrome de Guillain-Barre u otros problemas neurolgicos -problemas del sistema inmunolgico -infeccin por el virus de la inmunodeficiencia humana (VIH) o SIDA -niveles bajos de plaquetas en la sangre -esclerosis mltiple -una Risk analyst o inusual a las vacunas antigripales, a los huevos, protenas de pollo, al ltex, a la gentamicina, a otros medicamentos, alimentos, colorantes o conservantes -si est embarazada o buscando quedar embarazada -si est amamantando a un beb Cmo debo utilizar este medicamento? Esta vacuna se administra mediante inyeccin por va intramuscular. Lo administra un profesional de [employer]	[place]. Recibir una copia de informacin escrita sobre la vacuna antes de cada vacuna. Asegrese de leer este folleto cada vez cuidadosamente. Este folleto puede cambiar con frecuencia. Hable con su pediatra para informarse acerca del uso de este medicamento en nios. Puede requerir atencin especial. Sobredosis: Pngase en contacto inmediatamente con un centro toxicolgico o una sala de  urgencia si usted cree que haya tomado demasiado medicamento. ATENCIN: [employer] es solo para usted. No comparta este medicamento con nadie. Qu sucede si me olvido de una dosis? No se aplica en este caso. Qu puede interactuar con este medicamento? -quimioterapia o radioterapia -medicamentos que suprimen el sistema inmunolgico, tales como etanercept, anakinra, infliximab y adalimumab -medicamentos que tratan o previenen cogulos sanguneos, como warfarina -fenitona -medicamentos esteroideos, como la prednisona o la cortisona -teofilina -vacunas Puede ser que esta lista no menciona todas las posibles interacciones. Informe a su profesional de [employer]	[place] de [employer] productos a base de hierbas, medicamentos de [place] o suplementos nutritivos que est tomando. Si usted fuma, consume bebidas alcohlicas o si utiliza drogas ilegales, indqueselo tambin a su profesional de [employer]	[place]. Algunas sustancias pueden interactuar con su medicamento. A qu debo estar atento al usar [employer]? Informe a su mdico o a Barrister's clerk de la [employer] todos los efectos secundarios que persistan despus de 3 das. Llame a su proveedor de atencin mdica si se presentan sntomas inusuales dentro de las 6 semanas posteriores a la vacunacin. Es posible que todava pueda contraer la gripe, pero la enfermedad no ser tan fuerte como normalmente. No puede contraer la gripe de esta vacuna. La vacuna antigripal no le protege contra resfros u otras enfermedades que pueden causar [place]. Debe vacunarse cada ao. Qu efectos secundarios puedo tener al [employer] este medicamento? Efectos secundarios que debe informar a su mdico o a Barrister's clerk de la salud tan pronto como sea posible: -reacciones alrgicas como erupcin cutnea, picazn o urticarias, hinchazn de la cara, labios  o lengua Efectos secundarios que, por lo general, no requieren atencin mdica (debe informarlos a su mdico o a su  profesional de la salud si persisten o si son molestos): -fiebre -dolor de cabeza -molestias y dolores musculares -dolor, sensibilidad, enrojecimiento o Estate agent de la inyeccin -cansancio o debilidad Puede ser que esta lista no menciona todos los posibles efectos secundarios. Comunquese a su mdico por asesoramiento mdico [employer] Usted puede informar los efectos secundarios a la FDA por telfono al 1-800-FDA-1088. Dnde debo guardar mi medicina? Esta vacuna se administra solamente en clnicas, farmacias, consultorio mdico u otro consultorio de un profesional de la salud y no Sports coach en su domicilio. ATENCIN: Este folleto es un resumen. Puede ser que no cubra toda la posible informacin. Si usted tiene preguntas acerca de esta medicina, consulte con su mdico, su farmacutico o su profesional de Technical sales engineer.  2018 Elsevier/Gold Standard (2009-08-28 15:31:40)

## 2016-12-08 NOTE — Progress Notes (Signed)
Patient is here for left leg pain   Patient complains left ankle

## 2016-12-09 LAB — RHEUMATOID FACTOR: Rhuematoid fact SerPl-aCnc: 55.3 IU/mL — ABNORMAL HIGH (ref 0.0–13.9)

## 2016-12-10 MED FILL — ?FEXOFENADINE HCL 180 MG TA: 180 | 30 days supply | Qty: 30 | Fill #3

## 2016-12-15 ENCOUNTER — Other Ambulatory Visit: Payer: Self-pay | Admitting: Family Medicine

## 2016-12-15 DIAGNOSIS — M0579 Rheumatoid arthritis with rheumatoid factor of multiple sites without organ or systems involvement: Secondary | ICD-10-CM | POA: Insufficient documentation

## 2016-12-15 MED ORDER — METHOTREXATE SODIUM 7.5 MG PO TABS: ORAL_TABLET | ORAL | 0 refills | Status: DC

## 2016-12-16 MED FILL — METHOTREXATE 2.5 MG TABLET: 2.5 | 28 days supply | Qty: 12 | Fill #0

## 2016-12-18 ENCOUNTER — Telehealth: Payer: Self-pay | Admitting: Family Medicine

## 2016-12-18 ENCOUNTER — Telehealth: Payer: Self-pay

## 2016-12-18 NOTE — Telephone Encounter (Signed)
Patient return CMA call  Patient verify DOB   Patient was aware and understood  

## 2016-12-18 NOTE — Telephone Encounter (Signed)
Pt called to get the lab result, please follow up

## 2016-12-18 NOTE — Telephone Encounter (Signed)
-----   Message from Alfonse Spruce, [place] sent at 12/15/2016  5:21 PM EDT ----- RF labs is positive. This indicates you have rheumatoid arthritis. You will be prescribed methotrexate.  You will be referred to a rheumatologist.  Recommend follow up in 6 weeks.

## 2016-12-18 NOTE — Telephone Encounter (Signed)
CMA call regarding  lab results   Patient did not answer husband did left a message to cal back at the office regarding her results

## 2017-02-05 MED FILL — ?FEXOFENADINE HCL 180 MG TA: 180 | 30 days supply | Qty: 30 | Fill #4

## 2017-02-05 MED FILL — METHOTREXATE 2.5 MG TABLET: 2.5 | 14 days supply | Qty: 6 | Fill #1

## 2017-02-25 ENCOUNTER — Ambulatory Visit: Payer: Self-pay | Attending: Family Medicine

## 2017-03-11 MED FILL — ?FEXOFENADINE HCL 180 MG TA: 180 | 30 days supply | Qty: 30 | Fill #5

## 2017-03-24 ENCOUNTER — Ambulatory Visit: Payer: Self-pay | Attending: Family Medicine | Admitting: Family Medicine

## 2017-03-24 ENCOUNTER — Encounter: Payer: Self-pay | Admitting: Family Medicine

## 2017-03-24 VITALS — BP 132/82 | HR 80 | Temp 98.3°F | Resp 18 | Ht 61.0 in | Wt 142.0 lb

## 2017-03-24 DIAGNOSIS — R7303 Prediabetes: Secondary | ICD-10-CM | POA: Insufficient documentation

## 2017-03-24 DIAGNOSIS — K219 Gastro-esophageal reflux disease without esophagitis: Secondary | ICD-10-CM | POA: Insufficient documentation

## 2017-03-24 DIAGNOSIS — Z1239 Encounter for other screening for malignant neoplasm of breast: Secondary | ICD-10-CM

## 2017-03-24 DIAGNOSIS — Z1231 Encounter for screening mammogram for malignant neoplasm of breast: Secondary | ICD-10-CM

## 2017-03-24 DIAGNOSIS — M0579 Rheumatoid arthritis with rheumatoid factor of multiple sites without organ or systems involvement: Secondary | ICD-10-CM | POA: Insufficient documentation

## 2017-03-24 LAB — POCT GLYCOSYLATED HEMOGLOBIN (HGB A1C): Hemoglobin A1C: 5.6

## 2017-03-24 MED ORDER — OMEPRAZOLE 20 MG PO CPDR: 20.0000 mg | DELAYED_RELEASE_CAPSULE | Freq: Every day | ORAL | 2 refills | Status: DC

## 2017-03-24 MED ORDER — METHOTREXATE SODIUM 2.5 MG PO TABS
ORAL_TABLET | ORAL | 0 refills | Status: DC
Start: 2017-03-24 — End: 2017-05-18

## 2017-03-24 MED FILL — ?METHOTREXATE 2.5 MG TABLET: 2.5 | 28 days supply | Qty: 12 | Fill #0

## 2017-03-24 MED FILL — ?OMEPRAZOLE DR 20MG CAPSULE: 20 | 30 days supply | Qty: 30 | Fill #0

## 2017-03-24 NOTE — Progress Notes (Signed)
Subjective:  Patient ID: Veronica Hart, female    DOB: December 13, 1955  Age: 62 y.o. MRN: [id number]  CC: Medication Refill   HPI Kimbery Harwood presents for   Rheumatoid arthritis involving multiple sites with positive rheumatoid factor Chronic arthralgias for which has been present for over  6 months. Pain is located in multiple joints, (hips, knees, hands, and ankles) is described as aching, and is constant. Rheumatoid factor obtained and was positive. She reports adherence methotrexate medications. She has completed The patient has tried NSAID's and injections-which she reports helped relieved symptoms.  Related to injury:   No.  Gastroesophageal reflux disease without esophagitis She reports symptoms of heartburn, epigastric cramping, and nausea. Symptoms aggravated with drinking coffee strawberries and methotrexate use. She reports eating after methotrexate use. She has tried her husbands omeprazole for relief of symptoms. She denies any hematemesis, dysphagia, or melena.     Prediabetes History of pre-diabetes. She denies any symptoms. She has not made any changes to her diet. She is not exercising.       Outpatient Medications Prior to Visit  Medication Sig Dispense Refill  . Naproxen (NAPROSYN PO) Take 220 mg by mouth every 8 (eight) hours as needed.    . indomethacin (INDOCIN) 25 MG capsule TAKE ONE CAPSULE BY MOUTH TWICE A DAY AS NEEDED WITH MEALS FOR PAIN. 40 capsule 0  . methotrexate (RHEUMATREX) 7.5 MG tablet TAKE ONE TABLET BY MOUTH ONCE A WEEK. Caution" Chemotherapy. Protect from light. 6 tablet 0   No facility-administered medications prior to visit.     ROS Review of Systems  Constitutional: Negative.   Respiratory: Negative.   Cardiovascular: Negative.   Gastrointestinal: Positive for abdominal pain (epigastric).       Heartburn  Musculoskeletal: Positive for arthralgias.  Psychiatric/Behavioral: Negative for suicidal ideas.    Objective:  BP  132/82 (BP Location: Left Arm, Patient Position: Sitting, Cuff Size: Normal)   Pulse 80   Temp 98.3 F (36.8 C) (Oral)   Resp 18   Ht 5\' 1"  (1.549 m)   Wt 142 lb (64.4 kg)   SpO2 95%   BMI 26.83 kg/m   BP/Weight 03/24/2017 12/08/2016 2/29/7989  Systolic BP 211 941 740  Diastolic BP 82 75 83  Wt. (Lbs) 142 140.6 140  BMI 26.83 28.4 29.26     Physical Exam  Constitutional: She appears well-developed and well-nourished.  Cardiovascular: Normal rate, regular rhythm, normal heart sounds and intact distal pulses.  Pulmonary/Chest: Effort normal and breath sounds normal.  Abdominal: Soft. Bowel sounds are normal. There is no tenderness.  Musculoskeletal:       Right hand: She exhibits bony tenderness. She exhibits normal range of motion. Normal strength noted.       Left hand: She exhibits bony tenderness. She exhibits normal range of motion. Normal strength noted.  Skin: Skin is warm and dry.  Psychiatric: She has a normal mood and affect.  Nursing note and vitals reviewed.    Assessment & Plan:   1. Rheumatoid arthritis involving multiple sites with positive rheumatoid factor (HCC)  - methotrexate 2.5 MG tablet; TAKE THREE TABLET BY MOUTH AT ONCE. ONCE A WEEK. Caution" Chemotherapy. Protect from light.  Dispense: 18 tablet; Refill: 0  2. Gastroesophageal reflux disease without esophagitis  - omeprazole (PRILOSEC) 20 MG capsule; Take 1 capsule (20 mg total) by mouth daily.  Dispense: 30 capsule; Refill: 2  3. Prediabetes  - HgB A1c  4. Breast cancer screening  - MM SCREENING BREAST TOMO  BILATERAL; Future        Follow-up: Return in about 6 weeks (around 05/05/2017) for GERD/ RA.   Alfonse Spruce FNP

## 2017-03-24 NOTE — Patient Instructions (Signed)
Opciones de alimentos para pacientes con reflujo gastroesofgico - Adultos (Food Choices for Gastroesophageal Reflux Disease, Adult) Cuando se tiene reflujo gastroesofgico (ERGE), los alimentos que se ingieren y los hbitos de alimentacin son muy importantes. Elegir los alimentos adecuados puede ayudar a [employer]. [place]?  Elija las frutas, los vegetales, los cereales integrales y los productos lcteos con bajo contenido de [place].  [place], de pescado y de ave con bajo contenido de grasas.  Limite las grasas, como los [place], los aderezos para [place], la [place], los frutos secos y Publishing copy.  Lleve un registro de alimentos. Esto ayuda a identificar los alimentos que ocasionan sntomas.  Evite los alimentos que le ocasionen sntomas. Pueden ser distintos para cada persona.  Haga comidas pequeas durante Psychiatrist de 3 comidas abundantes.  Coma lentamente, en un lugar donde est distendido.  Limite el consumo de alimentos fritos.  Cocine los alimentos utilizando mtodos que no sean la fritura.  Evite el consumo alcohol.  Evite beber grandes cantidades de lquidos con las comidas.  Evite agacharse o recostarse hasta despus de 2 o 3horas de haber comido.  QU ALIMENTOS NO SE RECOMIENDAN? Estos son algunos alimentos y bebidas que pueden empeorar los sntomas: Astronomer. Jugo de tomate. Salsa de tomate y espagueti. Ajes. [place]ebolla y [place]. Rbano picante. Frutas [place], pomelos y limn (fruta y [place]). Carnes Carnes de [place], de pescado y de ave con gran contenido de grasas. Esto incluye los perros calientes, las [place], el [place], la salchicha, el salame y el tocino. Lcteos Leche entera y [place]. [employer]. Crema. [place]. Helados. Queso crema. Bebidas T o caf. Bebidas gaseosas o bebidas energizantes. Condimentos Salsa picante. Salsa barbacoa. Dulces/postres Chocolate y cacao. [place].  Menta y mentol. Grasas y Alber[employer]on's. Esto incluye las papas fritas. Otros [place]. Especias picantes. Esto incluye la pimienta negra, la pimienta blanca, la pimienta roja, la pimienta de cayena, el curry en polvo, los clavos de [place], el jengibre y el [place] en polvo. Esta no es [employer] de los alimentos y las bebidas que se Higher education careers adviser. Comunquese con el nutricionista para recibir ms informacin. Esta informacin no tiene Marine scientist el consejo del mdico. Asegrese de hacerle al mdico cualquier pregunta que tenga. Document Released: 08/26/2011 Document Revised: 03/17/2014 Document Reviewed: 12/29/2012 Elsevier Interactive Patient Education  2017 [place] reumatoide (Rheumatoid Arthritis) La artritis reumatoide (AR) es una enfermedad a largo plazo (crnica). La AR causa inflamacin en las articulaciones. Las articulaciones pueden estar doloridas, rgidas, hinchadas, calientes o sensibles a la palpacin. La AR puede comenzar lentamente. A menudo, afecta las articulaciones [place] y los pies. Tambin puede afectar otras partes del cuerpo, incluso el corazn, los ojos o los pulmones. Los sntomas de la AR suelen ser intermitentes. En ocasiones, los sntomas empeoran durante un perodo. Estos perodos se denominan crisis. La AR no tiene [place], pero el mdico trabajar con usted para Pension scheme manager la opcin de tratamiento ms [place] para su caso. Esto depender de la evolucin de la enfermedad en su organismo. La [place] los medicamentos de venta libre y los recetados solamente como se lo haya indicado el mdico. El mdico puede ajustarle los medicamentos cada 75meses.  Comience un programa de actividad fsica segn las indicaciones del mdico.  Descanse cuando tenga una crisis.  Retome sus actividades habituales como se lo haya indicado el mdico. Pregntele al mdico qu  actividades son seguras para  usted.  Concurra a todas las visitas de control como se lo haya indicado el mdico. Esto es importante. SOLICITE AYUDA SI:  Tiene una crisis.  Tiene fiebre.  Tiene problemas (efectos secundarios) debido a los medicamentos. SOLICITE AYUDA DE INMEDIATO SI:  Siente dolor en el pecho.  Tiene dificultad para respirar.  Tiene calor y dolor en una articulacin de forma repentina, y esto es ms intenso que sus dolores articulares habituales. Esta informacin no tiene Marine scientist el consejo del mdico. Asegrese de hacerle al mdico cualquier pregunta que tenga. Document Released: 08/26/2011 Document Revised: 06/18/2015 Document Reviewed: 12/07/2014 Elsevier Interactive Patient Education  Henry Schein.

## 2017-04-20 MED FILL — ?METHOTREXATE 2.5 MG TABLET: 2.5 | 14 days supply | Qty: 6 | Fill #1

## 2017-04-20 MED FILL — ?OMEPRAZOLE 20 MG CPDR: 20 | 30 days supply | Qty: 30 | Fill #1

## 2017-05-12 ENCOUNTER — Ambulatory Visit: Payer: Self-pay | Attending: Internal Medicine | Admitting: Internal Medicine

## 2017-05-12 ENCOUNTER — Encounter: Payer: Self-pay | Admitting: Internal Medicine

## 2017-05-12 VITALS — BP 154/90 | HR 83 | Temp 98.2°F | Resp 16 | Ht 63.0 in | Wt 141.0 lb

## 2017-05-12 DIAGNOSIS — M1612 Unilateral primary osteoarthritis, left hip: Secondary | ICD-10-CM | POA: Insufficient documentation

## 2017-05-12 DIAGNOSIS — H8110 Benign paroxysmal vertigo, unspecified ear: Secondary | ICD-10-CM | POA: Insufficient documentation

## 2017-05-12 DIAGNOSIS — R058 Other specified cough: Secondary | ICD-10-CM

## 2017-05-12 DIAGNOSIS — K219 Gastro-esophageal reflux disease without esophagitis: Secondary | ICD-10-CM | POA: Insufficient documentation

## 2017-05-12 DIAGNOSIS — R030 Elevated blood-pressure reading, without diagnosis of hypertension: Secondary | ICD-10-CM

## 2017-05-12 DIAGNOSIS — Z79899 Other long term (current) drug therapy: Secondary | ICD-10-CM | POA: Insufficient documentation

## 2017-05-12 DIAGNOSIS — R7303 Prediabetes: Secondary | ICD-10-CM | POA: Insufficient documentation

## 2017-05-12 DIAGNOSIS — G8929 Other chronic pain: Secondary | ICD-10-CM | POA: Insufficient documentation

## 2017-05-12 DIAGNOSIS — M0579 Rheumatoid arthritis with rheumatoid factor of multiple sites without organ or systems involvement: Secondary | ICD-10-CM

## 2017-05-12 DIAGNOSIS — R05 Cough: Secondary | ICD-10-CM

## 2017-05-12 DIAGNOSIS — M25552 Pain in left hip: Secondary | ICD-10-CM | POA: Insufficient documentation

## 2017-05-12 MED ORDER — BENZONATATE 100 MG PO CAPS: 100.0000 mg | ORAL_CAPSULE | Freq: Two times a day (BID) | ORAL | 0 refills | Status: DC | PRN

## 2017-05-12 MED ORDER — FOLIC ACID 1 MG PO TABS
1.0000 mg | ORAL_TABLET | Freq: Every day | ORAL | 2 refills | Status: DC
Start: 2017-05-12 — End: 2017-07-13

## 2017-05-12 MED ORDER — FLUTICASONE PROPIONATE 50 MCG/ACT NA SUSP: 1.0000 | Freq: Every day | NASAL | 2 refills | Status: DC

## 2017-05-12 MED FILL — FLUTICASONE PROP 50 MCG SPR: 50 | 30 days supply | Qty: 16 | Fill #0

## 2017-05-12 MED FILL — ?FOLIC ACID 1 MG TABLET: 1 | 30 days supply | Qty: 30 | Fill #0

## 2017-05-12 MED FILL — BENZONATATE 100 MG CAP: 100 | 15 days supply | Qty: 30 | Fill #0

## 2017-05-12 NOTE — Patient Instructions (Signed)
Stop Naprosyn. Decrease salt intake. Eat more fresh fruits and veggies. Try to exercise 3 times a week for 30 minutes.   Artritis reumatoide (Rheumatoid Arthritis) La artritis reumatoide (AR) es una enfermedad a largo plazo (crnica). La AR causa inflamacin en las articulaciones. Las articulaciones pueden estar doloridas, rgidas, hinchadas, calientes o sensibles a la palpacin. La AR puede comenzar lentamente. A menudo, afecta las articulaciones [place] y los pies. Tambin puede afectar otras partes del cuerpo, incluso el corazn, los ojos o los pulmones. Los sntomas de la AR suelen ser intermitentes. En ocasiones, los sntomas empeoran durante un perodo. Estos perodos se denominan crisis. La AR no tiene [place], pero el mdico trabajar con usted para Pension scheme manager la opcin de tratamiento ms [place] para su caso. Esto depender de la evolucin de la enfermedad en su organismo. [place] los medicamentos de venta libre y los recetados solamente como se lo haya indicado el mdico. El mdico puede ajustarle los medicamentos cada 34meses.  Comience un programa de actividad fsica segn las indicaciones del mdico.  Descanse cuando tenga una crisis.  Retome sus actividades habituales como se lo haya indicado el mdico. Pregntele al mdico qu actividades son seguras para usted.  Concurra a todas las visitas de control como se lo haya indicado el mdico. Esto es importante. SOLICITE AYUDA SI:  Tiene una crisis.  Tiene fiebre.  Tiene problemas (efectos secundarios) debido a los medicamentos. SOLICITE AYUDA DE INMEDIATO SI:  Siente dolor en el pecho.  Tiene dificultad para respirar.  Tiene calor y dolor en una articulacin de forma repentina, y esto es ms intenso que sus dolores articulares habituales. Esta informacin no tiene Marine scientist el consejo del mdico. Asegrese de hacerle al mdico cualquier pregunta que tenga. Document Released:  08/26/2011 Document Revised: 06/18/2015 Document Reviewed: 12/07/2014 Elsevier Interactive Patient Education  2018 [employer].   Hipertensin Hypertension La hipertensin, conocida comnmente como presin arterial alta, se produce cuando la sangre bombea en las arterias con mucha fuerza. Las arterias son los vasos sanguneos que transportan la sangre desde el corazn al resto del cuerpo. La hipertensin hace que el corazn haga ms esfuerzo para Chiropodist y [employer] Corporation que las arterias se Teacher, music o Advertising account executive. La hipertensin no tratada o no controlada puede causar infarto de miocardio, accidentes cerebrovasculares, enfermedad renal y otros problemas. Una lectura de la presin arterial consiste de un nmero ms alto sobre un nmero ms bajo. En condiciones ideales, la presin arterial debe estar por debajo de 120/80. El primer nmero ("superior") es la presin sistlica. Es la medida de la presin de las arterias cuando el corazn late. El segundo nmero ("inferior") es la presin diastlica. Es la medida de la presin en las arterias cuando el corazn se relaja. Cules son las causas? Se desconoce la causa de esta afeccin. Qu incrementa el riesgo? Algunos factores de riesgo de hipertensin estn bajo su control. Otros no. Factores que puede [employer].  Tener diabetes mellitus tipo 2, colesterol alto, o ambos.  No hacer la cantidad suficiente de actividad fsica o ejercicio.  Tener sobrepeso.  Consumir mucha grasa, azcar, caloras o sal (sodio) en su dieta.  Beber alcohol en exceso. Factores que son difciles o imposibles de modificar  Tener enfermedad renal crnica.  Tener antecedentes familiares de presin arterial alta.  La edad. Los riesgos aumentan con la edad.  La raza. El riesgo es mayor para las Retail banker.  El sexo. Antes de los  45aos, los hombres corren ms [employer]. Despus de los 65aos, las mujeres corren ms  [employer].  Tener apnea obstructiva del sueo.  El estrs. Cules son los signos o los sntomas? La presin arterial extremadamente alta (crisis hipertensiva) puede provocar:  Dolor de [place].  Ansiedad.  Falta de aire.  Hemorragia nasal.  Nuseas y vmitos.  Dolor de pecho intenso.  Una crisis de movimientos que no puede controlar (convulsiones).  Cmo se diagnostica? Esta afeccin se diagnostica midiendo su presin arterial mientras se encuentra sentado, con el brazo apoyado sobre una superficie. El brazalete del tensimetro debe colocarse directamente sobre la piel de la parte superior del brazo y al nivel de su corazn. Debe medirla al [facility] veces en el mismo brazo. Determinadas condiciones pueden causar una diferencia de presin arterial entre el brazo izquierdo y Insurance underwriter. Ciertos factores pueden provocar que las lecturas de la presin arterial sean inferiores o superiores a lo normal (elevadas) por un perodo corto de tiempo:  Si su presin arterial es ms alta cuando se encuentra en el consultorio del mdico que cuando la mide en su hogar, se denomina "hipertensin de bata blanca". La State Farm de las personas que tienen esta afeccin no deben ser [employer].  Si su presin arterial es ms alta en el hogar que cuando se encuentra en el consultorio del mdico, se denomina "hipertensin enmascarada". La [employer] de las personas que tienen esta afeccin deben ser medicadas para Chief Technology Officer la presin arterial.  Si tiene una lecturas de presin arterial alta durante una visita o si tiene presin arterial normal con otros factores de riesgo:  Es posible que se le pida que regrese Administrator, arts para volver a Chief Technology Officer su presin arterial.  Se le puede pedir que se controle la presin arterial en su casa durante 1 semana o ms.  Si se le diagnostica hipertensin, es posible que se le realicen otros anlisis de sangre o estudios de diagnstico por imgenes para ayudar a  su mdico a comprender su riesgo general de tener otras afecciones. Cmo se trata? Esta afeccin se trata haciendo cambios saludables en el estilo de vida, tales como ingerir alimentos saludables, realizar ms ejercicio y reducir el consumo de alcohol. El mdico puede recetarle medicamentos si los cambios en el estilo de vida no son suficientes para Child psychotherapist la presin arterial y si:  Su presin arterial sistlica est por encima de 130.  Su presin arterial diastlica est por encima de 80.  La presin arterial deseada puede variar en funcin de las enfermedades, la edad y otros factores personales. Siga estas instrucciones en su casa: Comida y bebida  Siga una dieta con alto contenido de fibras y [place], y con bajo contenido de sodio, Location manager agregada y Physicist, medical. Un ejemplo de plan alimenticio es la dieta DASH (Dietary Approaches to Stop Hypertension, Mtodos alimenticios para detener la hipertensin). Para alimentarse de esta manera: ? Coma mucha fruta y [place]. Trate de que la mitad del plato de cada comida sea de frutas y verduras. ? Coma cereales integrales, como pasta integral, arroz integral y pan integral. Llene aproximadamente un cuarto del plato con cereales integrales. ? Coma y beba productos lcteos con bajo contenido de grasa, como leche descremada o yogur bajo en grasas. ? Evite la ingesta de cortes de carne grasa, carne procesada o curada, y carne de ave con piel. Llene aproximadamente un cuarto del plato con protenas magras, como pescado, pollo sin piel, frijoles, huevos y tofu. ?  Evite ingerir alimentos prehechos o procesados. En general, estos tienen mayor cantidad de sodio, azcar agregada y Wendee Copp.  Reduzca su ingesta diaria de sodio. La mayora de las personas que tienen hipertensin deben comer menos de 1500 mg de sodio por [employer].  Limite el consumo de alcohol a no ms de 1 medida por da si es mujer y no est Music therapist y a 2 medidas por da si es hombre. Una  medida equivale a 12onzas de cerveza, 5onzas de vino o 1onzas de bebidas alcohlicas de alta graduacin. Estilo de vida  Trabaje con su mdico para mantener un peso saludable o Administrator, Civil Service. Pregntele cual es su peso recomendado.  Realice al menos 30 minutos de ejercicio que haga que se acelere su corazn (ejercicio Arboriculturist) la [employer] de la [place]. Estas actividades pueden incluir caminar, nadar o andar en bicicleta.  Incluya ejercicios para fortalecer sus msculos (ejercicios de resistencia), como pilates o levantamiento de pesas, como parte de su rutina semanal de ejercicios. Intente realizar 59minutos de este tipo de ejercicios al [employer] Corporation a la [place].  No consuma ningn producto que contenga nicotina o tabaco, como cigarrillos y Psychologist, sport and exercise. Si necesita ayuda para dejar de fumar, consulte al mdico.  Contrlese la presin arterial en su casa segn las indicaciones del mdico.  Concurra a todas las visitas de control como se lo haya indicado el mdico. Esto es importante. Medicamentos  [employer] de venta libre y los recetados solamente como se lo haya indicado el mdico. Siga cuidadosamente las indicaciones. Los medicamentos para la presin arterial deben tomarse segn las indicaciones.  No omita las dosis de medicamentos para la presin arterial. Si lo hace, estar en riesgo de tener problemas y puede hacer que los medicamentos sean menos eficaces.  Pregntele a su mdico a qu efectos secundarios o reacciones a los Careers information officer. Comunquese con un mdico si:  Piensa que tiene una reaccin a un medicamento que est tomando.  Tiene dolores de cabeza frecuentes (recurrentes).  Siente mareos.  Tiene hinchazn en los tobillos.  Tiene problemas de visin. Solicite ayuda de inmediato si:  Siente un dolor de cabeza intenso o confusin.  Siente debilidad inusual o adormecimiento.  Siente que va a  desmayarse.  Siente un dolor intenso en el pecho o el abdomen.  Vomita repetidas veces.  Tiene dificultad para respirar. Resumen  La hipertensin se produce cuando la sangre bombea en las arterias con mucha fuerza. Si esta afeccin no se controla, podra correr riesgo de tener complicaciones graves.  La presin arterial deseada puede variar en funcin de las enfermedades, la edad y otros factores personales. Para la [employer], una presin arterial normal es menor que 120/80.  La hipertensin se trata con cambios en el estilo de vida, medicamentos o una combinacin de Ewa Gentry. Los McDonald[employer] Corporation estilo de vida incluyen prdida de peso, ingerir alimentos sanos, seguir una dieta baja en sodio, hacer ms ejercicio y Environmental consultant consumo de alcohol. Esta informacin no tiene Marine scientist el consejo del mdico. Asegrese de hacerle al mdico cualquier pregunta que tenga. Document Released: 02/24/2005 Document Revised: 02/06/2016 Document Reviewed: 02/06/2016 Elsevier Interactive Patient Education  [employer]in.

## 2017-05-12 NOTE — Progress Notes (Signed)
Patient ID: Veronica Hart, female    DOB: [birth year]  MRN: [id number]  CC: re-establish   Subjective: Veronica Hart is a 62 y.o. female who presents for f/u and to est with me as PCP.  Her concerns today include:  Patient with history of RA, GERD, prediabetes  RA:  Needs RF on MTX.  She has been on it about 8 wks -has OC but has not seen Rheumatologist. Reports they do not take OC.  Has problem with transportation also.  Depends on spouse who works. -c/o poking feelings in the bones of her fingers at nights. Takes Naprosyn PRN. -jts that are most bothersome are MCP/PIP BL, wrists and one toe on RT foot. No pain in knees or hips.  Had inj in hip "so pain went away."  C/o wet cough x 1 mth.  Productive of dark brown and sometimes green phlegm.  Sick contacts at work -no SOB, fever - a lot of rhinorrhea, + congestion Gives hx of allergies.  + itchy throat. No sneezing.  Taking OTC Loratadine Using OTC cold med, last took last wk. Patient Active Problem List   Diagnosis Date Noted  . Rheumatoid arthritis involving multiple sites with positive rheumatoid factor ([place]) 12/15/2016  . Benign paroxysmal positional vertigo due to bilateral vestibular disorder 09/15/2016  . Chronic left hip pain 07/28/2016  . Elevated rheumatoid factor 05/08/2016  . Arthritis of left hip 05/05/2016  . Environmental allergies 05/05/2016  . Allergic rhinitis 11/21/2013  . Prediabetes 05/17/2013  . Language barrier, speaks Spanish only 02/21/2013  . Arthritis 02/14/2013     Current Outpatient Medications on File Prior to Visit  Medication Sig Dispense Refill  . methotrexate 2.5 MG tablet TAKE THREE TABLET BY MOUTH AT ONCE. ONCE A WEEK. Caution" Chemotherapy. Protect from light. 18 tablet 0  . omeprazole (PRILOSEC) 20 MG capsule Take 1 capsule (20 mg total) by mouth daily. 30 capsule 2  . Naproxen (NAPROSYN PO) Take 220 mg by mouth every 8 (eight) hours as needed.     No current  facility-administered medications on file prior to visit.     No Known Allergies  Social History   Socioeconomic History  . Marital status: Married    Spouse name: Not on file  . Number of children: Not on file  . Years of education: Not on file  . Highest education level: Not on file  Social Needs  . Financial resource strain: Not on file  . Food insecurity - worry: Not on file  . Food insecurity - inability: Not on file  . Transportation needs - medical: Not on file  . Transportation needs - non-medical: Not on file  Occupational History  . Not on file  Tobacco Use  . Smoking status: Never Smoker  . Smokeless tobacco: Never Used  Substance and Sexual Activity  . Alcohol use: No    Alcohol/week: 0.0 oz  . Drug use: No  . Sexual activity: No    Birth control/protection: Surgical  Other Topics Concern  . Not on file  Social History Narrative  . Not on file    Family History  Problem Relation Age of Onset  . Colon cancer Neg Hx   . Arthritis Mother     Past Surgical History:  Procedure Laterality Date  . TUBAL LIGATION      ROS: Review of Systems As above PHYSICAL EXAM: BP (!) 154/90   Pulse 83   Temp 98.2 F (36.8 C) (Oral)   Resp 16  Ht 5\' 3"  (1.6 m)   Wt 141 lb (64 kg)   SpO2 95%   BMI 24.98 kg/m   Wt Readings from Last 3 Encounters:  05/12/17 141 lb (64 kg)  03/24/17 142 lb (64.4 kg)  12/08/16 140 lb 9.6 oz (63.8 kg)   BP 160/96 BL (last took Naprosyn yesterday.  Takes it 2-3 x a wk) Physical Exam General appearance - alert, well appearing, and in no distress Mental status - alert, oriented to person, place, and time, normal mood, behavior, speech, dress, motor activity, and thought processes Eyes - pupils equal and reactive, extraocular eye movements intact Nose - turbinates verey enlarged and edematous Mouth - mucous membranes moist, pharynx normal without lesions Neck - supple, no significant adenopathy Chest - clear to auscultation, no  wheezes, rales or rhonchi, symmetric air entry Heart - normal rate, regular rhythm, normal S1, S2, no murmurs, rubs, clicks or gallops Musculoskeletal -mildly enlarged MCP jts RT>LT. Mild flexion deformity of RT 5th finger Extremities - peripheral pulses normal, no pedal edema, no clubbing or cyanosis  ASSESSMENT AND PLAN: 1. Rheumatoid arthritis involving multiple sites with positive rheumatoid factor ([place]) -We will submit referral for rheumatologist again.  Email sent to our referral coordinator. She needs baseline labs, x-rays of the hand and daily folic acid.  Advised patient to take the folic acid every day while she is on methotrexate.  Went over possible side effects of the medications including drug-induced hepatitis, bone marrow suppression - Comprehensive metabolic panel - Lipid panel - CBC with Differential/Platelet - folic acid (FOLVITE) 1 MG tablet; Take 1 tablet (1 mg total) by mouth daily.  Dispense: 100 tablet; Refill: 2 - DG Hand Complete Left; Future - DG Hand Complete Right; Future - CYCLIC CITRUL PEPTIDE ANTIBODY, IGG/IGA  2. Elevated blood pressure reading Stop Naprosyn. Decrease salt intake. Eat more fresh fruits and veggies. Try to exercise 3 times a week for 30 minutes. Return in 2 weeks for blood pressure recheck  3. Productive cough Ddx include allergies with post-nasal drip vs MTX induce pneumonitis - fluticasone (FLONASE) 50 MCG/ACT nasal spray; Place 1 spray into both nostrils daily.  Dispense: 16 g; Refill: 2 - DG Chest 2 View; Future - benzonatate (TESSALON) 100 MG capsule; Take 1 capsule (100 mg total) by mouth 2 (two) times daily as needed for cough.  Dispense: 30 capsule; Refill: 0  Patient was given the opportunity to ask questions.  Patient verbalized understanding of the plan and was able to repeat key elements of the plan.  [employer] interpreter used during this encounter.  Orders Placed This Encounter  Procedures  . DG Hand Complete Left  . DG  Hand Complete Right  . DG Chest 2 View  . Comprehensive metabolic panel  . Lipid panel  . CBC with Differential/Platelet  . CYCLIC CITRUL PEPTIDE ANTIBODY, IGG/IGA  . Ambulatory referral to Rheumatology     Requested Prescriptions   Signed Prescriptions Disp Refills  . folic acid (FOLVITE) 1 MG tablet 100 tablet 2    Sig: Take 1 tablet (1 mg total) by mouth daily.  . fluticasone (FLONASE) 50 MCG/ACT nasal spray 16 g 2    Sig: Place 1 spray into both nostrils daily.  . benzonatate (TESSALON) 100 MG capsule 30 capsule 0    Sig: Take 1 capsule (100 mg total) by mouth 2 (two) times daily as needed for cough.    Return in about 2 weeks (around 05/26/2017) for BP recheck.  Karle Plumber, MD, FACP

## 2017-05-13 ENCOUNTER — Telehealth: Payer: Self-pay

## 2017-05-13 NOTE — Telephone Encounter (Signed)
Contacted pt to go over lab results Veronica Hart translated for me and pt is aware and doesn't have any questions or concerns

## 2017-05-14 LAB — LIPID PANEL
CHOLESTEROL TOTAL: 185 mg/dL (ref 100–199)
Chol/HDL Ratio: 4.1 ratio (ref 0.0–4.4)
HDL: 45 mg/dL (ref >39)
LDL Calculated: 110 mg/dL — ABNORMAL HIGH (ref 0–99)
TRIGLYCERIDES: 148 mg/dL (ref 0–149)
VLDL CHOLESTEROL CAL: 30 mg/dL (ref 5–40)

## 2017-05-14 LAB — COMPREHENSIVE METABOLIC PANEL
A/G RATIO: 1.3 (ref 1.2–2.2)
ALK PHOS: 100 IU/L (ref 39–117)
ALT: 39 IU/L — AB (ref 0–32)
AST: 27 IU/L (ref 0–40)
Albumin: 4.3 g/dL (ref 3.6–4.8)
BUN/Creatinine Ratio: 18 (ref 12–28)
BUN: 13 mg/dL (ref 8–27)
Bilirubin Total: 0.3 mg/dL (ref 0.0–1.2)
CHLORIDE: 105 mmol/L (ref 96–106)
CO2: 24 mmol/L (ref 20–29)
Calcium: 9.5 mg/dL (ref 8.7–10.3)
Creatinine, Ser: 0.74 mg/dL (ref 0.57–1.00)
GFR calc Af Amer: 102 mL/min/{1.73_m2} (ref >59)
GFR calc non Af Amer: 88 mL/min/{1.73_m2} (ref >59)
GLUCOSE: 99 mg/dL (ref 65–99)
Globulin, Total: 3.2 g/dL (ref 1.5–4.5)
Potassium: 4.1 mmol/L (ref 3.5–5.2)
Sodium: 142 mmol/L (ref 134–144)
TOTAL PROTEIN: 7.5 g/dL (ref 6.0–8.5)

## 2017-05-14 LAB — CBC WITH DIFFERENTIAL/PLATELET
BASOS: 1 %
Basophils Absolute: 0.1 10*3/uL (ref 0.0–0.2)
EOS (ABSOLUTE): 0.8 10*3/uL — ABNORMAL HIGH (ref 0.0–0.4)
EOS: 12 %
HEMATOCRIT: 38.9 % (ref 34.0–46.6)
HEMOGLOBIN: 13.6 g/dL (ref 11.1–15.9)
Immature Grans (Abs): 0 10*3/uL (ref 0.0–0.1)
Immature Granulocytes: 0 %
LYMPHS ABS: 2.1 10*3/uL (ref 0.7–3.1)
Lymphs: 33 %
MCH: 31.3 pg (ref 26.6–33.0)
MCHC: 35 g/dL (ref 31.5–35.7)
MCV: 89 fL (ref 79–97)
MONOCYTES: 7 %
Monocytes Absolute: 0.5 10*3/uL (ref 0.1–0.9)
Neutrophils Absolute: 3.1 10*3/uL (ref 1.4–7.0)
Neutrophils: 47 %
Platelets: 290 10*3/uL (ref 150–379)
RBC: 4.35 x10E6/uL (ref 3.77–5.28)
RDW: 14.2 % (ref 12.3–15.4)
WBC: 6.4 10*3/uL (ref 3.4–10.8)

## 2017-05-14 LAB — CYCLIC CITRUL PEPTIDE ANTIBODY, IGG/IGA: Cyclic Citrullin Peptide Ab: 4 units (ref 0–19)

## 2017-05-14 MED FILL — ?OMEPRAZOLE DR 20MG CAPSULE: 20 | 30 days supply | Qty: 30 | Fill #2

## 2017-05-18 ENCOUNTER — Telehealth: Payer: Self-pay | Admitting: Pharmacist

## 2017-05-18 ENCOUNTER — Other Ambulatory Visit: Payer: Self-pay | Admitting: Internal Medicine

## 2017-05-18 DIAGNOSIS — M0579 Rheumatoid arthritis with rheumatoid factor of multiple sites without organ or systems involvement: Secondary | ICD-10-CM

## 2017-05-18 MED ORDER — METHOTREXATE SODIUM 2.5 MG PO TABS: ORAL_TABLET | ORAL | 0 refills | Status: DC

## 2017-05-18 NOTE — Telephone Encounter (Signed)
Received fax from [facility] requesting refill for methotrexate. Will forward to Dr. Wynetta Emery so saw the patient last.

## 2017-05-19 MED FILL — ?METHOTREXATE 2.5 MG TABLET: 2.5 | 42 days supply | Qty: 18 | Fill #0

## 2017-05-20 ENCOUNTER — Telehealth: Payer: Self-pay | Admitting: Internal Medicine

## 2017-05-20 NOTE — Telephone Encounter (Signed)
Pt called to say that she still has the cough and it wont go away so she would like something to treat the symptoms. Please follow up

## 2017-05-21 NOTE — Telephone Encounter (Signed)
Could you please make patient aware

## 2017-05-21 NOTE — Telephone Encounter (Signed)
Will forward to pcp

## 2017-05-21 NOTE — Telephone Encounter (Signed)
Pt called and she was informed of the x-rays that have been ordered, she would like to inform her PCP that the [name] is not working which she has been taking for days. Please advise.

## 2017-05-21 NOTE — Telephone Encounter (Signed)
[facility] [phone] contacted pt and pt didn't answer left a detailed vm informing Dr. Wynetta Emery response and if she has any questions or concerns to give me a call

## 2017-05-25 ENCOUNTER — Ambulatory Visit: Payer: Self-pay | Attending: Internal Medicine

## 2017-05-25 ENCOUNTER — Telehealth: Payer: Self-pay | Admitting: Internal Medicine

## 2017-05-25 ENCOUNTER — Ambulatory Visit (HOSPITAL_COMMUNITY)
Admission: RE | Admit: 2017-05-25 | Discharge: 2017-05-25 | Disposition: A | Discharge status: Home / Self Care | Payer: Self-pay | Source: Ambulatory Visit | Attending: Internal Medicine | Admitting: Internal Medicine

## 2017-05-25 DIAGNOSIS — R05 Cough: Secondary | ICD-10-CM | POA: Insufficient documentation

## 2017-05-25 DIAGNOSIS — R058 Other specified cough: Secondary | ICD-10-CM

## 2017-05-25 DIAGNOSIS — M0579 Rheumatoid arthritis with rheumatoid factor of multiple sites without organ or systems involvement: Secondary | ICD-10-CM | POA: Insufficient documentation

## 2017-05-25 MED ORDER — BENZONATATE 100 MG PO CAPS: 100.0000 mg | ORAL_CAPSULE | Freq: Two times a day (BID) | ORAL | 0 refills | Status: DC | PRN

## 2017-05-25 NOTE — Telephone Encounter (Signed)
Rf sent on Tessalon Perles.

## 2017-05-25 NOTE — Addendum Note (Signed)
Addended by: Karle Plumber B on: 05/25/2017 09:26 PM   Modules accepted: Orders

## 2017-05-25 NOTE — Telephone Encounter (Signed)
Pt aware, and appt scheduled

## 2017-05-25 NOTE — Telephone Encounter (Addendum)
Pt came in to request a medication refill on  -benzonatate (TESSALON) 100 MG capsule  Please follow up Send to [facility]

## 2017-05-25 NOTE — Telephone Encounter (Deleted)
Send to [facility] pharmacy

## 2017-05-26 ENCOUNTER — Ambulatory Visit: Payer: Self-pay | Attending: Internal Medicine | Admitting: *Deleted

## 2017-05-26 ENCOUNTER — Telehealth: Payer: Self-pay

## 2017-05-26 VITALS — BP 125/82 | HR 76

## 2017-05-26 DIAGNOSIS — R030 Elevated blood-pressure reading, without diagnosis of hypertension: Secondary | ICD-10-CM | POA: Insufficient documentation

## 2017-05-26 DIAGNOSIS — Z0131 Encounter for examination of blood pressure with abnormal findings: Secondary | ICD-10-CM | POA: Insufficient documentation

## 2017-05-26 MED FILL — BENZONATATE 100 MG CAP: 100 | 15 days supply | Qty: 30 | Fill #0

## 2017-05-26 NOTE — Telephone Encounter (Signed)
[facility] [phone] contacted pt to go over xray results pt is aware and doesn't have any questions or concerns

## 2017-05-26 NOTE — Progress Notes (Signed)
Pt arrived to [facility] for blood pressure check.  Pt denies chest pain, SOB, HA, dizziness, or blurred vision.  Verified medication with patient  Pt states medication was taken this morning. She does not take medications for hypertension. She was asked to return to  Recheck BP today due to high reading at last office visit 05/12/17.  Blood pressure reading: 125/82

## 2017-05-28 ENCOUNTER — Telehealth: Payer: Self-pay | Admitting: Internal Medicine

## 2017-05-28 ENCOUNTER — Other Ambulatory Visit: Payer: Self-pay | Admitting: Internal Medicine

## 2017-05-28 DIAGNOSIS — R058 Other specified cough: Secondary | ICD-10-CM

## 2017-05-28 DIAGNOSIS — R05 Cough: Secondary | ICD-10-CM

## 2017-05-28 NOTE — Telephone Encounter (Signed)
Pt. Called requesting a refill on benzonatate (TESSALON) 100 MG capsule Pt. States she she still has the cough. Pt. Uses [facility] pharmacy. Please f/u

## 2017-05-28 NOTE — Telephone Encounter (Signed)
Refilled a few days ago by PCP, ready for pick up at pharmacy.

## 2017-05-28 NOTE — Telephone Encounter (Signed)
Pt. Called requesting a refill on benzonatate (TESSALON) 100 MG capsule Pt. States she she still has the cough. Pt. Uses [facility] pharmacy. Please f/u

## 2017-06-05 MED FILL — ?FOLIC ACID 1 MG TABLET: 1 | 30 days supply | Qty: 30 | Fill #1

## 2017-07-01 ENCOUNTER — Other Ambulatory Visit: Payer: Self-pay | Admitting: Obstetrics and Gynecology

## 2017-07-01 DIAGNOSIS — Z1231 Encounter for screening mammogram for malignant neoplasm of breast: Secondary | ICD-10-CM

## 2017-07-06 MED FILL — ?FOLIC ACID 1 MG TABLET: 1 | 30 days supply | Qty: 30 | Fill #2

## 2017-07-13 ENCOUNTER — Encounter: Payer: Self-pay | Admitting: Internal Medicine

## 2017-07-13 ENCOUNTER — Ambulatory Visit: Payer: Self-pay | Attending: Internal Medicine | Admitting: Internal Medicine

## 2017-07-13 VITALS — BP 137/83 | HR 76 | Temp 97.8°F | Resp 16 | Wt 138.4 lb

## 2017-07-13 DIAGNOSIS — R030 Elevated blood-pressure reading, without diagnosis of hypertension: Secondary | ICD-10-CM | POA: Insufficient documentation

## 2017-07-13 DIAGNOSIS — R7303 Prediabetes: Secondary | ICD-10-CM | POA: Insufficient documentation

## 2017-07-13 DIAGNOSIS — M1990 Unspecified osteoarthritis, unspecified site: Secondary | ICD-10-CM | POA: Insufficient documentation

## 2017-07-13 DIAGNOSIS — K219 Gastro-esophageal reflux disease without esophagitis: Secondary | ICD-10-CM | POA: Insufficient documentation

## 2017-07-13 DIAGNOSIS — Z8261 Family history of arthritis: Secondary | ICD-10-CM | POA: Insufficient documentation

## 2017-07-13 DIAGNOSIS — L659 Nonscarring hair loss, unspecified: Secondary | ICD-10-CM | POA: Insufficient documentation

## 2017-07-13 DIAGNOSIS — M549 Dorsalgia, unspecified: Secondary | ICD-10-CM | POA: Insufficient documentation

## 2017-07-13 MED ORDER — OMEPRAZOLE 20 MG PO CPDR: 20.0000 mg | DELAYED_RELEASE_CAPSULE | Freq: Every day | ORAL | 2 refills | Status: DC

## 2017-07-13 MED ORDER — NAPROXEN 500 MG PO TABS: 500.0000 mg | ORAL_TABLET | Freq: Two times a day (BID) | ORAL | 1 refills | Status: DC | PRN

## 2017-07-13 MED FILL — ?OMEPRAZOLE DR 20MG CAPSULE: 20 | 30 days supply | Qty: 30 | Fill #0

## 2017-07-13 MED FILL — NAPROXEN 500 MG TABLET: 500 | 30 days supply | Qty: 60 | Fill #0

## 2017-07-13 NOTE — Progress Notes (Signed)
Patient ID: Veronica Hart, female    DOB: 10/17/1955  MRN: [id number]  CC: Medication Management   Subjective: Veronica Hart is a 62 y.o. female who presents for chronic ds management. Her concerns today include:  Patient with history of RA, GERD, prediabetes  1.  GERD:  Vomited 1 wk ago in the middle of night.  Stomach was upset.  Other than that, her GERD usually controlled on Prilosec.  Wants RF   2.  RA:   Referred to Rheumatology on last visit for clarification of dx. Options are [facility] or [facility] in [place]. Pt reports transportation and lack of finances are issues.  Her husband has vision issues so they try to stay in [place].   -x-rays of hands were negative -she stopped the MTX 1 mth ago.  Did not find it helpful after taking for several mths and when she took it, "it felt like needles in my bones."  -lately she has been having a lot of back pain b/w scapular.  No initiating factors Takes Naprosyn PRN.  Does not have to take it every day.   3.  Hair loss x several yrs. - brother and son have similar partner of hair loss. -does not use chemicals on hair.  Patient Active Problem List   Diagnosis Date Noted  . Rheumatoid arthritis involving multiple sites with positive rheumatoid factor ([place]) 12/15/2016  . Benign paroxysmal positional vertigo due to bilateral vestibular disorder 09/15/2016  . Chronic left hip pain 07/28/2016  . Elevated rheumatoid factor 05/08/2016  . Arthritis of left hip 05/05/2016  . Environmental allergies 05/05/2016  . Allergic rhinitis 11/21/2013  . Prediabetes 05/17/2013  . Language barrier, speaks Spanish only 02/21/2013  . Arthritis 02/14/2013     Current Outpatient Medications on File Prior to Visit  Medication Sig Dispense Refill  . fluticasone (FLONASE) 50 MCG/ACT nasal spray Place 1 spray into both nostrils daily. 16 g 2   No current facility-administered medications on file prior to visit.     No Known  Allergies  Social History   Socioeconomic History  . Marital status: Married    Spouse name: Not on file  . Number of children: Not on file  . Years of education: Not on file  . Highest education level: Not on file  Occupational History  . Not on file  Social Needs  . Financial resource strain: Not on file  . Food insecurity:    Worry: Not on file    Inability: Not on file  . Transportation needs:    Medical: Not on file    Non-medical: Not on file  Tobacco Use  . Smoking status: Never Smoker  . Smokeless tobacco: Never Used  Substance and Sexual Activity  . Alcohol use: No    Alcohol/week: 0.0 oz  . Drug use: No  . Sexual activity: Never    Birth control/protection: Surgical  Lifestyle  . Physical activity:    Days per week: Not on file    Minutes per session: Not on file  . Stress: Not on file  Relationships  . Social connections:    Talks on phone: Not on file    Gets together: Not on file    Attends religious service: Not on file    Active member of club or organization: Not on file    Attends meetings of clubs or organizations: Not on file    Relationship status: Not on file  . Intimate partner violence:  Fear of current or ex partner: Not on file    Emotionally abused: Not on file    Physically abused: Not on file    Forced sexual activity: Not on file  Other Topics Concern  . Not on file  Social History Narrative  . Not on file    Family History  Problem Relation Age of Onset  . Colon cancer Neg Hx   . Arthritis Mother     Past Surgical History:  Procedure Laterality Date  . TUBAL LIGATION      ROS: Review of Systems Neg except as above PHYSICAL EXAM: BP 137/83   Pulse 76   Temp 97.8 F (36.6 C) (Oral)   Resp 16   Wt 138 lb 6.4 oz (62.8 kg)   SpO2 95%   BMI 24.52 kg/m   Repeat BP 140/80 Physical Exam  General appearance - alert, well appearing, and in no distress Mental status - normal mood, behavior, speech, dress, motor  activity, and thought processes Neck - supple, no significant adenopathy Chest - clear to auscultation, no wheezes, rales or rhonchi, symmetric air entry Heart - normal rate, regular rhythm, normal S1, S2, no murmurs, rubs, clicks or gallops Musculoskeletal - no signs of active inflammation/tinosynovitis of hands, wrists.  No tenderness on palpation of thoracic or LS spine Extremities - peripheral pulses normal, no pedal edema, no clubbing or cyanosis Skin -moderate thinning of hair on scalp in female pattern baldness   Results for orders placed or performed in visit on 05/12/17  Comprehensive metabolic panel  Result Value Ref Range   Glucose 99 65 - 99 mg/dL   BUN 13 8 - 27 mg/dL   Creatinine, Ser 0.74 0.57 - 1.00 mg/dL   GFR calc non Af Amer 88 >59 mL/min/1.73   GFR calc Af Amer 102 >59 mL/min/1.73   BUN/Creatinine Ratio 18 12 - 28   Sodium 142 134 - 144 mmol/L   Potassium 4.1 3.5 - 5.2 mmol/L   Chloride 105 96 - 106 mmol/L   CO2 24 20 - 29 mmol/L   Calcium 9.5 8.7 - 10.3 mg/dL   Total Protein 7.5 6.0 - 8.5 g/dL   Albumin 4.3 3.6 - 4.8 g/dL   Globulin, Total 3.2 1.5 - 4.5 g/dL   Albumin/Globulin Ratio 1.3 1.2 - 2.2   Bilirubin Total 0.3 0.0 - 1.2 mg/dL   Alkaline Phosphatase 100 39 - 117 IU/L   AST 27 0 - 40 IU/L   ALT 39 (H) 0 - 32 IU/L  Lipid panel  Result Value Ref Range   Cholesterol, Total 185 100 - 199 mg/dL   Triglycerides 148 0 - 149 mg/dL   HDL 45 >39 mg/dL   VLDL Cholesterol Cal 30 5 - 40 mg/dL   LDL Calculated 110 (H) 0 - 99 mg/dL   Chol/HDL Ratio 4.1 0.0 - 4.4 ratio  CBC with Differential/Platelet  Result Value Ref Range   WBC 6.4 3.4 - 10.8 x10E3/uL   RBC 4.35 3.77 - 5.28 x10E6/uL   Hemoglobin 13.6 11.1 - 15.9 g/dL   Hematocrit 38.9 34.0 - 46.6 %   MCV 89 79 - 97 fL   MCH 31.3 26.6 - 33.0 pg   MCHC 35.0 31.5 - 35.7 g/dL   RDW 14.2 12.3 - 15.4 %   Platelets 290 150 - 379 x10E3/uL   Neutrophils 47 Not Estab. %   Lymphs 33 Not Estab. %   Monocytes 7 Not  Estab. %   Eos 12 Not Estab. %  Basos 1 Not Estab. %   Neutrophils Absolute 3.1 1.4 - 7.0 x10E3/uL   Lymphocytes Absolute 2.1 0.7 - 3.1 x10E3/uL   Monocytes Absolute 0.5 0.1 - 0.9 x10E3/uL   EOS (ABSOLUTE) 0.8 (H) 0.0 - 0.4 x10E3/uL   Basophils Absolute 0.1 0.0 - 0.2 x10E3/uL   Immature Granulocytes 0 Not Estab. %   Immature Grans (Abs) 0.0 0.0 - 0.1 F11M2/TR  CYCLIC CITRUL PEPTIDE ANTIBODY, IGG/IGA  Result Value Ref Range   Cyclic Citrullin Peptide Ab 4 0 - 19 units     ASSESSMENT AND PLAN: 1. Gastroesophageal reflux disease without esophagitis Gerd precautions discussed - omeprazole (PRILOSEC) 20 MG capsule; Take 1 capsule (20 mg total) by mouth daily.  Dispense: 30 capsule; Refill: 2  2. Elevated blood pressure reading -borderline on last visit and also today.  No device at home to check BP.  Advise low salt intake  3. 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 Arthritis Stop MTX.  Would like to get her to specialist to have dx of RA clarified but there are barriers to doing so.   Will hold off on RF MTX and observe for any future symptoms to suggest RA.  Pain in back likely not RA as it usually does not involve thoracic and LS spine.  - naproxen (NAPROSYN) 500 MG tablet; Take 1 tablet (500 mg total) by mouth 2 (two) times daily as needed.  Dispense: 60 tablet; Refill: 1  Patient was given the opportunity to ask questions.  Patient verbalized understanding of the plan and was able to repeat key elements of the plan.  [employer] interpreter used during this encounter.     Requested Prescriptions   Signed Prescriptions Disp Refills  . omeprazole (PRILOSEC) 20 MG capsule 30 capsule 2    Sig: Take 1 capsule (20 mg total) by mouth daily.  . naproxen (NAPROSYN) 500 MG tablet 60 tablet 1    Sig: Take 1 tablet (500 mg total) by mouth 2 (two) times daily as needed.    Return in about 4 months (around 11/13/2017).  Karle Plumber, MD, FACP

## 2017-07-14 LAB — TSH: TSH: 0.744 u[IU]/mL (ref 0.450–4.500)

## 2017-07-16 ENCOUNTER — Ambulatory Visit (HOSPITAL_COMMUNITY): Payer: Self-pay

## 2017-07-17 ENCOUNTER — Telehealth: Payer: Self-pay

## 2017-07-17 NOTE — Telephone Encounter (Signed)
CMA call patient regarding lab results   Patient Verify DOB   Patient was aware and understood

## 2017-07-17 NOTE — Telephone Encounter (Signed)
-----   Message from Jackelyn Knife, [place] sent at 07/17/2017  2:24 PM EDT -----   ----- Message ----- From: Ladell Pier, MD Sent: 07/15/2017   8:19 AM To: Jackelyn Knife, RMA  Let patient know that her thyroid level was normal.

## 2017-07-31 ENCOUNTER — Ambulatory Visit: Payer: Self-pay

## 2017-08-05 MED FILL — ?OMEPRAZOLE DR 20MG CAPSULE: 20 | 30 days supply | Qty: 30 | Fill #1

## 2017-08-05 MED FILL — ?FOLIC ACID 1 MG TABLET: 1 | 30 days supply | Qty: 30 | Fill #3

## 2017-08-05 MED FILL — NAPROXEN 500 MG TABLET: 500 | 30 days supply | Qty: 60 | Fill #1

## 2017-08-24 ENCOUNTER — Ambulatory Visit: Payer: Self-pay | Attending: Internal Medicine

## 2017-09-02 MED FILL — ?OMEPRazole 20mg CPDR: 20 | 30 days supply | Qty: 30 | Fill #2

## 2017-09-02 MED FILL — FOLIC ACID 1 MG TABS: 1 | 30 days supply | Qty: 30 | Fill #4

## 2017-10-12 ENCOUNTER — Encounter: Payer: Self-pay | Admitting: Internal Medicine

## 2017-10-12 ENCOUNTER — Ambulatory Visit: Payer: Self-pay | Attending: Internal Medicine | Admitting: Internal Medicine

## 2017-10-12 VITALS — BP 138/83 | HR 69 | Temp 97.8°F | Resp 16 | Wt 139.0 lb

## 2017-10-12 DIAGNOSIS — M1612 Unilateral primary osteoarthritis, left hip: Secondary | ICD-10-CM | POA: Insufficient documentation

## 2017-10-12 DIAGNOSIS — H8110 Benign paroxysmal vertigo, unspecified ear: Secondary | ICD-10-CM | POA: Insufficient documentation

## 2017-10-12 DIAGNOSIS — Z79899 Other long term (current) drug therapy: Secondary | ICD-10-CM | POA: Insufficient documentation

## 2017-10-12 DIAGNOSIS — R030 Elevated blood-pressure reading, without diagnosis of hypertension: Secondary | ICD-10-CM | POA: Insufficient documentation

## 2017-10-12 DIAGNOSIS — K219 Gastro-esophageal reflux disease without esophagitis: Secondary | ICD-10-CM | POA: Insufficient documentation

## 2017-10-12 DIAGNOSIS — R7303 Prediabetes: Secondary | ICD-10-CM | POA: Insufficient documentation

## 2017-10-12 DIAGNOSIS — M1990 Unspecified osteoarthritis, unspecified site: Secondary | ICD-10-CM

## 2017-10-12 DIAGNOSIS — M069 Rheumatoid arthritis, unspecified: Secondary | ICD-10-CM | POA: Insufficient documentation

## 2017-10-12 DIAGNOSIS — Z760 Encounter for issue of repeat prescription: Secondary | ICD-10-CM | POA: Insufficient documentation

## 2017-10-12 MED ORDER — NAPROXEN 500 MG PO TABS: 500.0000 mg | ORAL_TABLET | Freq: Two times a day (BID) | ORAL | 6 refills | Status: DC | PRN

## 2017-10-12 MED ORDER — OMEPRAZOLE 20 MG PO CPDR: 20.0000 mg | DELAYED_RELEASE_CAPSULE | Freq: Every day | ORAL | 5 refills | Status: DC

## 2017-10-12 MED FILL — OMEPRAZOLE 20 MG CAP: 20 | 30 days supply | Qty: 30 | Fill #0

## 2017-10-12 MED FILL — NAPROXEN 500 MG TABLET: 500 | 30 days supply | Qty: 60 | Fill #0

## 2017-10-12 NOTE — Progress Notes (Signed)
Patient ID: Tarhonda Hollenberg, female    DOB: [birth year]  MRN: [id number]  CC: Arthritis and Medication Refill   Subjective: Dianely Krehbiel is a 62 y.o. female who presents for chronic ds management.  Her concerns today include:  Patient with history of RA, GERD, prediabetes  Needing RF on Omeprazole and Naprosyn. Not getting in any exercise due to her work schedule.  Reports that by the time she gets off of work she is too tired to do anything.  Her oldest son is also a patient of mine.  I saw him last week.  She wanted to discuss his health issues.  Patient Active Problem List   Diagnosis Date Noted  . Rheumatoid arthritis involving multiple sites with positive rheumatoid factor ([place]) 12/15/2016  . Benign paroxysmal positional vertigo due to bilateral vestibular disorder 09/15/2016  . Elevated rheumatoid factor 05/08/2016  . Arthritis of left hip 05/05/2016  . Environmental allergies 05/05/2016  . Allergic rhinitis 11/21/2013  . Prediabetes 05/17/2013  . Language barrier, speaks Spanish only 02/21/2013  . Arthritis 02/14/2013     Current Outpatient Medications on File Prior to Visit  Medication Sig Dispense Refill  . fluticasone (FLONASE) 50 MCG/ACT nasal spray Place 1 spray into both nostrils daily. 16 g 2   No current facility-administered medications on file prior to visit.     No Known Allergies  Social History   Socioeconomic History  . Marital status: Married    Spouse name: Not on file  . Number of children: Not on file  . Years of education: Not on file  . Highest education level: Not on file  Occupational History  . Not on file  Social Needs  . Financial resource strain: Not on file  . Food insecurity:    Worry: Not on file    Inability: Not on file  . Transportation needs:    Medical: Not on file    Non-medical: Not on file  Tobacco Use  . Smoking status: Never Smoker  . Smokeless tobacco: Never Used  Substance and Sexual Activity  .  Alcohol use: No    Alcohol/week: 0.0 oz  . Drug use: No  . Sexual activity: Never    Birth control/protection: Surgical  Lifestyle  . Physical activity:    Days per week: Not on file    Minutes per session: Not on file  . Stress: Not on file  Relationships  . Social connections:    Talks on phone: Not on file    Gets together: Not on file    Attends religious service: Not on file    Active member of club or organization: Not on file    Attends meetings of clubs or organizations: Not on file    Relationship status: Not on file  . Intimate partner violence:    Fear of current or ex partner: Not on file    Emotionally abused: Not on file    Physically abused: Not on file    Forced sexual activity: Not on file  Other Topics Concern  . Not on file  Social History Narrative  . Not on file    Family History  Problem Relation Age of Onset  . Colon cancer Neg Hx   . Arthritis Mother     Past Surgical History:  Procedure Laterality Date  . TUBAL LIGATION      ROS: Review of Systems  Constitutional: Negative for activity change.  Respiratory: Negative for chest tightness and shortness of breath.  Gastrointestinal:       Acid reflux is well controlled on omeprazole.  She requests refill today.    PHYSICAL EXAM: BP 138/83   Pulse 69   Temp 97.8 F (36.6 C) (Oral)   Resp 16   Wt 139 lb (63 kg)   SpO2 96%   BMI 24.62 kg/m   BP 150/90 Physical Exam  General appearance - alert, well appearing, and in no distress Mental status - normal mood, behavior, speech, dress, motor activity, and thought processes Chest - clear to auscultation, no wheezes, rales or rhonchi, symmetric air entry Heart - normal rate, regular rhythm, normal S1, S2, no murmurs, rubs, clicks or gallops Extremities - peripheral pulses normal, no pedal edema, no clubbing or cyanosis  ASSESSMENT AND PLAN: 1. Gastroesophageal reflux disease without esophagitis - omeprazole (PRILOSEC) 20 MG capsule;  Take 1 capsule (20 mg total) by mouth daily.  Dispense: 30 capsule; Refill: 5  2. Arthritis - naproxen (NAPROSYN) 500 MG tablet; Take 1 tablet (500 mg total) by mouth 2 (two) times daily as needed.  Dispense: 60 tablet; Refill: 6  3. Elevated blood pressure reading Blood pressure elevated today.  Blood pressure on previous visit were good.  She does have a blood pressure device at home which her husband uses.  I have asked her to check blood pressures once a week and bring in readings on next visit.  Goal is 130/80 or lower.  DASH diet discussed and encouraged  Patient was given the opportunity to ask questions.  Patient verbalized understanding of the plan and was able to repeat key elements of the plan.   No orders of the defined types were placed in this encounter.    Requested Prescriptions   Signed Prescriptions Disp Refills  . omeprazole (PRILOSEC) 20 MG capsule 30 capsule 5    Sig: Take 1 capsule (20 mg total) by mouth daily.  . naproxen (NAPROSYN) 500 MG tablet 60 tablet 6    Sig: Take 1 tablet (500 mg total) by mouth 2 (two) times daily as needed.    Return in about 3 months (around 01/12/2018).  Karle Plumber, MD, FACP

## 2017-11-05 MED FILL — NAPROXEN 500 MG TABLET: 500 | 30 days supply | Qty: 60 | Fill #1

## 2017-11-05 MED FILL — FOLIC ACID 1 MG TABS: 1 | 30 days supply | Qty: 30 | Fill #5

## 2017-11-05 MED FILL — OMEPRAZOLE 20 MG CAP: 20 | 30 days supply | Qty: 30 | Fill #1

## 2018-01-12 ENCOUNTER — Ambulatory Visit: Payer: Self-pay | Attending: Internal Medicine | Admitting: Internal Medicine

## 2018-01-12 ENCOUNTER — Encounter: Payer: Self-pay | Admitting: Internal Medicine

## 2018-01-12 VITALS — BP 143/85 | HR 72 | Temp 98.0°F | Resp 16 | Wt 141.4 lb

## 2018-01-12 DIAGNOSIS — R7303 Prediabetes: Secondary | ICD-10-CM | POA: Insufficient documentation

## 2018-01-12 DIAGNOSIS — K219 Gastro-esophageal reflux disease without esophagitis: Secondary | ICD-10-CM | POA: Insufficient documentation

## 2018-01-12 DIAGNOSIS — R030 Elevated blood-pressure reading, without diagnosis of hypertension: Secondary | ICD-10-CM

## 2018-01-12 DIAGNOSIS — Z79899 Other long term (current) drug therapy: Secondary | ICD-10-CM | POA: Insufficient documentation

## 2018-01-12 DIAGNOSIS — M0579 Rheumatoid arthritis with rheumatoid factor of multiple sites without organ or systems involvement: Secondary | ICD-10-CM | POA: Insufficient documentation

## 2018-01-12 DIAGNOSIS — R058 Other specified cough: Secondary | ICD-10-CM

## 2018-01-12 DIAGNOSIS — Z1239 Encounter for other screening for malignant neoplasm of breast: Secondary | ICD-10-CM

## 2018-01-12 DIAGNOSIS — R05 Cough: Secondary | ICD-10-CM | POA: Insufficient documentation

## 2018-01-12 MED ORDER — FEXOFENADINE HCL 60 MG PO TABS
60.0000 mg | ORAL_TABLET | Freq: Two times a day (BID) | ORAL | 1 refills | Status: DC
Start: 2018-01-12 — End: 2018-04-20

## 2018-01-12 MED ORDER — BENZONATATE 100 MG PO CAPS: 100.0000 mg | ORAL_CAPSULE | Freq: Three times a day (TID) | ORAL | 0 refills | Status: DC | PRN

## 2018-01-12 MED ORDER — FLUTICASONE PROPIONATE 50 MCG/ACT NA SUSP: 1.0000 | Freq: Every day | NASAL | 2 refills | Status: DC

## 2018-01-12 MED FILL — FLUTICASONE PROP 50 MCG SPR: 50 | 30 days supply | Qty: 16 | Fill #0

## 2018-01-12 MED FILL — BENZONATATE 100 MG CAP: 100 | 6 days supply | Qty: 20 | Fill #0

## 2018-01-12 NOTE — Patient Instructions (Signed)
Please call the [facility] (breast and cervical cancer control program) at [phone] to schedule diagnostic mammogram

## 2018-01-12 NOTE — Progress Notes (Signed)
Pt states she is having some discomfort in her left hand pt states she thinks its from arthritis

## 2018-01-12 NOTE — Progress Notes (Signed)
Patient ID: Veronica Hart, female    DOB: 1955-05-14  MRN: [id number]  CC: Hypertension   Subjective: Veronica Hart is a 62 y.o. female who presents for chronic ds management. Husband is with her. Her concerns today include:  Patient with history of ? RA, GERD, prediabetes  She has been using a Tumeric tea which has helped to decrease inflammation in hands.    BP elevated on last visit.  Pt has been checking BP 1-2 x wk.  She has log; some of her readings:  129/73, 132/73, 127/63, 135/75, 130/76, 118/64. Limits salt in foods. She walks 3 days a wk for exercise.   C/o dry cough this a.m.  Feeling of dust in the throat.  No sore throat.  Some running nose and sneezing.  Out of Flonase.  States that she is also out of [place].   Patient Active Problem List   Diagnosis Date Noted  . Rheumatoid arthritis involving multiple sites with positive rheumatoid factor ([place]) 12/15/2016  . Benign paroxysmal positional vertigo due to bilateral vestibular disorder 09/15/2016  . Elevated rheumatoid factor 05/08/2016  . Arthritis of left hip 05/05/2016  . Environmental allergies 05/05/2016  . Allergic rhinitis 11/21/2013  . Prediabetes 05/17/2013  . Language barrier, speaks Spanish only 02/21/2013  . Arthritis 02/14/2013     Current Outpatient Medications on File Prior to Visit  Medication Sig Dispense Refill  . naproxen (NAPROSYN) 500 MG tablet Take 1 tablet (500 mg total) by mouth 2 (two) times daily as needed. 60 tablet 6  . omeprazole (PRILOSEC) 20 MG capsule Take 1 capsule (20 mg total) by mouth daily. 30 capsule 5   No current facility-administered medications on file prior to visit.     No Known Allergies  Social History   Socioeconomic History  . Marital status: Married    Spouse name: Not on file  . Number of children: Not on file  . Years of education: Not on file  . Highest education level: Not on file  Occupational History  . Not on file  Social Needs  .  Financial resource strain: Not on file  . Food insecurity:    Worry: Not on file    Inability: Not on file  . Transportation needs:    Medical: Not on file    Non-medical: Not on file  Tobacco Use  . Smoking status: Never Smoker  . Smokeless tobacco: Never Used  Substance and Sexual Activity  . Alcohol use: No    Alcohol/week: 0.0 standard drinks  . Drug use: No  . Sexual activity: Never    Birth control/protection: Surgical  Lifestyle  . Physical activity:    Days per week: Not on file    Minutes per session: Not on file  . Stress: Not on file  Relationships  . Social connections:    Talks on phone: Not on file    Gets together: Not on file    Attends religious service: Not on file    Active member of club or organization: Not on file    Attends meetings of clubs or organizations: Not on file    Relationship status: Not on file  . Intimate partner violence:    Fear of current or ex partner: Not on file    Emotionally abused: Not on file    Physically abused: Not on file    Forced sexual activity: Not on file  Other Topics Concern  . Not on file  Social History Narrative  .  Not on file    Family History  Problem Relation Age of Onset  . Colon cancer Neg Hx   . Arthritis Mother     Past Surgical History:  Procedure Laterality Date  . TUBAL LIGATION      ROS: Review of Systems Neg except as stated above PHYSICAL EXAM: BP (!) 143/85   Pulse 72   Temp 98 F (36.7 C) (Oral)   Resp 16   Wt 141 lb 6.4 oz (64.1 kg)   SpO2 97%   BMI 25.05 kg/m   Physical Exam  General appearance - alert, well appearing, and in no distress Mental status - normal mood, behavior, speech, dress, motor activity, and thought processes Mouth - mucous membranes moist, pharynx normal without lesions  Nose: Moderate to severe enlargement of nasal turbinates bilaterally Neck - supple, no significant adenopathy Chest - clear to auscultation, no wheezes, rales or rhonchi, symmetric  air entry Heart - normal rate, regular rhythm, normal S1, S2, no murmurs, rubs, clicks or gallops Extremities - peripheral pulses normal, no pedal edema, no clubbing or cyanosis  Depression screen [facility] 2/9 01/12/2018 07/13/2017 05/12/2017  Decreased Interest 0 0 0  Down, Depressed, Hopeless 0 0 0  PHQ - 2 Score 0 0 0  Altered sleeping - - 0  Tired, decreased energy - - 0  Change in appetite - - 0  Feeling bad or failure about yourself  - - 0  Trouble concentrating - - 0  Moving slowly or fidgety/restless - - 0  Suicidal thoughts - - 0  PHQ-9 Score - - 0   ASSESSMENT AND PLAN: 1. Elevated blood pressure reading Blood pressure again elevated on today's visit.  However her home blood pressure readings for the most part of been acceptable.  Patient to continue low-salt diet and regular exercise.  2. Upper airway cough syndrome - fexofenadine (ALLEGRA ALLERGY) 60 MG tablet; Take 1 tablet (60 mg total) by mouth 2 (two) times daily.  Dispense: 60 tablet; Refill: 1 - fluticasone (FLONASE) 50 MCG/ACT nasal spray; Place 1 spray into both nostrils daily.  Dispense: 16 g; Refill: 2 - benzonatate (TESSALON PERLES) 100 MG capsule; Take 1 capsule (100 mg total) by mouth 3 (three) times daily as needed for cough.  Dispense: 20 capsule; Refill: 0  3. Breast cancer screening - MM Digital Screening; Future  Patient was given the opportunity to ask questions.  Patient verbalized understanding of the plan and was able to repeat key elements of the plan.  [employer] interpreter used during this encounter. #[id number].  Orders Placed This Encounter  Procedures  . MM Digital Screening     Requested Prescriptions   Signed Prescriptions Disp Refills  . fexofenadine (ALLEGRA ALLERGY) 60 MG tablet 60 tablet 1    Sig: Take 1 tablet (60 mg total) by mouth 2 (two) times daily.  . fluticasone (FLONASE) 50 MCG/ACT nasal spray 16 g 2    Sig: Place 1 spray into both nostrils daily.  . benzonatate (TESSALON PERLES) 100  MG capsule 20 capsule 0    Sig: Take 1 capsule (100 mg total) by mouth 3 (three) times daily as needed for cough.    Return in about 3 months (around 04/14/2018).  Karle Plumber, MD, FACP

## 2018-02-19 MED FILL — NAPROXEN 500 MG TABLET: 500 | 30 days supply | Qty: 60 | Fill #2

## 2018-02-19 MED FILL — OMEPRAZOLE 20 MG CAP: 20 | 30 days supply | Qty: 30 | Fill #2

## 2018-02-19 MED FILL — FLUTICASONE PROP 50 MCG SPR: 50 | 30 days supply | Qty: 16 | Fill #1

## 2018-03-08 ENCOUNTER — Ambulatory Visit: Payer: Self-pay | Attending: Family Medicine

## 2018-03-23 ENCOUNTER — Encounter: Payer: Self-pay | Admitting: Critical Care Medicine

## 2018-03-23 ENCOUNTER — Ambulatory Visit: Payer: Self-pay | Attending: Critical Care Medicine | Admitting: Critical Care Medicine

## 2018-03-23 VITALS — BP 125/77 | HR 78 | Temp 98.6°F | Resp 18 | Ht 59.0 in | Wt 139.0 lb

## 2018-03-23 DIAGNOSIS — M25521 Pain in right elbow: Secondary | ICD-10-CM

## 2018-03-23 NOTE — Assessment & Plan Note (Signed)
Subacute right elbow pain in the lateral region of the joint.  No evidence of overt trauma. No joint swelling seen.  Plan Referral to orthopedics for further evaluation as they have seen the patient in the past for other joint issues  I held off doing any imaging of the elbow pending orthopedic evaluation  I did refill the Naprosyn and recommended the patient take this twice daily as needed.

## 2018-03-23 NOTE — Patient Instructions (Signed)
A referral to orthopedics be made to evaluate your elbow pain Continue Naprosyn twice daily as needed for pain and continue wearing the brace on your arm

## 2018-03-23 NOTE — Progress Notes (Signed)
Subjective:    Patient ID: Veronica Hart, female    DOB: 02-27-1956, 63 y.o.   MRN: [id number]  63 y.o.F with work in visit for elbow pain.   The patient carries a diagnosis of rheumatoid arthritis with seropositive rheumatoid arthritis titers.  She works in [employer] with increased arm movement of the right arm.  She is noted onset over the past month increased pain in the right elbow laterally.  The pain is worse in the evening.  The pain is an aching and stabbing pain there is radiation down to the hand in the ulnar distribution.  She denies any specific episode of injury.   Arm Pain   The incident occurred more than 1 week ago (chronic pain started end of December ). There was no injury mechanism. The pain is present in the left elbow. The quality of the pain is described as aching and shooting. The pain radiates to the left hand. The pain has been worsening since the incident. Pertinent negatives include no muscle weakness, numbness or tingling. The symptoms are aggravated by movement and lifting. She has tried NSAIDs for the symptoms. The treatment provided moderate relief.   Past Medical History:  Diagnosis Date  . Allergy    seasonal  . Arthritis      Family History  Problem Relation Age of Onset  . Arthritis Mother   . Colon cancer Neg Hx      Social History   Socioeconomic History  . Marital status: Married    Spouse name: Not on file  . Number of children: Not on file  . Years of education: Not on file  . Highest education level: Not on file  Occupational History  . Not on file  Social Needs  . Financial resource strain: Not on file  . Food insecurity:    Worry: Not on file    Inability: Not on file  . Transportation needs:    Medical: Not on file    Non-medical: Not on file  Tobacco Use  . Smoking status: Never Smoker  . Smokeless tobacco: Never Used  Substance and Sexual Activity  . Alcohol use: No    Alcohol/week: 0.0 standard drinks  . Drug  use: No  . Sexual activity: Not Currently    Birth control/protection: Surgical  Lifestyle  . Physical activity:    Days per week: Not on file    Minutes per session: Not on file  . Stress: Not on file  Relationships  . Social connections:    Talks on phone: Not on file    Gets together: Not on file    Attends religious service: Not on file    Active member of club or organization: Not on file    Attends meetings of clubs or organizations: Not on file    Relationship status: Not on file  . Intimate partner violence:    Fear of current or ex partner: Not on file    Emotionally abused: Not on file    Physically abused: Not on file    Forced sexual activity: Not on file  Other Topics Concern  . Not on file  Social History Narrative  . Not on file     No Known Allergies   Outpatient Medications Prior to Visit  Medication Sig Dispense Refill  . fexofenadine (ALLEGRA ALLERGY) 60 MG tablet Take 1 tablet (60 mg total) by mouth 2 (two) times daily. 60 tablet 1  . fluticasone (FLONASE) 50 MCG/ACT nasal spray  Place 1 spray into both nostrils daily. 16 g 2  . naproxen (NAPROSYN) 500 MG tablet Take 1 tablet (500 mg total) by mouth 2 (two) times daily as needed. 60 tablet 6  . omeprazole (PRILOSEC) 20 MG capsule Take 1 capsule (20 mg total) by mouth daily. 30 capsule 5  . benzonatate (TESSALON PERLES) 100 MG capsule Take 1 capsule (100 mg total) by mouth 3 (three) times daily as needed for cough. (Patient not taking: Reported on 03/23/2018) 20 capsule 0   No facility-administered medications prior to visit.       Review of Systems  Constitutional: Negative.   HENT: Negative.   Respiratory: Negative.   Cardiovascular: Negative.   Musculoskeletal: Negative for arthralgias, back pain, gait problem, joint swelling, myalgias, neck pain and neck stiffness.  Skin: Negative.   Neurological: Negative for dizziness, tingling, weakness, light-headedness, numbness and headaches.    Hematological: Negative.   Psychiatric/Behavioral: Negative.        Objective:   Physical Exam Gen: Pleasant, well-nourished, in no distress,  normal affect  ENT: No lesions,  mouth clear,  oropharynx clear, no postnasal drip  Neck: No JVD, no TMG, no carotid bruits  Lungs: No use of accessory muscles, no dullness to percussion, clear without rales or rhonchi  Cardiovascular: RRR, heart sounds normal, no murmur or gallops, no peripheral edema  Abdomen: soft and NT, no HSM,  BS normal  Musculoskeletal: No deformities, no cyanosis or clubbing The right elbow is tender laterally along the epicondyles.  There is full range of motion of the joint.  Upon pronation and supination there is increased pain in the elbow area.  There are no sensory deficits in the hand.  There is no joint deformity seen.  There is no no evidence of effusion or swelling in the olecranon bursa.  Neuro: alert, non focal  Skin: Warm, no lesions or rashes         Assessment & Plan:  I personally reviewed all images and lab data in the [facility] system as well as any outside material available during this office visit and agree with the  radiology impressions.   Right elbow pain Subacute right elbow pain in the lateral region of the joint.  No evidence of overt trauma. No joint swelling seen.  Plan Referral to orthopedics for further evaluation as they have seen the patient in the past for other joint issues  I held off doing any imaging of the elbow pending orthopedic evaluation  I did refill the Naprosyn and recommended the patient take this twice daily as needed.   Deneene was seen today for elbow injury.  Diagnoses and all orders for this visit:  Right elbow pain -     Ambulatory referral to Orthopedic Surgery

## 2018-03-29 ENCOUNTER — Telehealth: Payer: Self-pay | Admitting: Internal Medicine

## 2018-03-29 NOTE — Telephone Encounter (Signed)
Pt called to request a copy of her CAFA letter, I left a copy of the letter in the folder

## 2018-03-30 ENCOUNTER — Ambulatory Visit (INDEPENDENT_AMBULATORY_CARE_PROVIDER_SITE_OTHER): Payer: Self-pay | Admitting: Orthopaedic Surgery

## 2018-03-30 ENCOUNTER — Ambulatory Visit (INDEPENDENT_AMBULATORY_CARE_PROVIDER_SITE_OTHER): Payer: Self-pay

## 2018-03-30 ENCOUNTER — Encounter (INDEPENDENT_AMBULATORY_CARE_PROVIDER_SITE_OTHER): Payer: Self-pay | Admitting: Orthopaedic Surgery

## 2018-03-30 DIAGNOSIS — M7711 Lateral epicondylitis, right elbow: Secondary | ICD-10-CM | POA: Insufficient documentation

## 2018-03-30 MED ORDER — LIDOCAINE HCL 1 % IJ SOLN
1.0000 mL | INTRAMUSCULAR | Status: AC | PRN
  Administered 2018-03-30: 1 mL

## 2018-03-30 MED ORDER — BUPIVACAINE HCL 0.25 % IJ SOLN
0.3300 mL | INTRAMUSCULAR | Status: AC | PRN
  Administered 2018-03-30: .33 mL

## 2018-03-30 MED ORDER — METHYLPREDNISOLONE ACETATE 40 MG/ML IJ SUSP
40.0000 mg | INTRAMUSCULAR | Status: AC | PRN
  Administered 2018-03-30: 40 mg

## 2018-03-30 NOTE — Progress Notes (Signed)
Office Visit Note   Patient: Veronica Hart           Date of Birth: Sep 03, 1955           MRN: [id number] Visit Date: 03/30/2018              Requested by: Elsie Stain, MD [address], [place] [address] PCP: Ladell Pier, MD   Assessment & Plan: Visit Diagnoses:  1. Lateral epicondylitis of right elbow     Plan: Impression is right elbow lateral epicondylitis.  We will inject this with cortisone today.  We will also provide the patient with a stretching program and a tennis elbow strap.  Follow-up with [place] as needed.  Follow-Up Instructions: Return if symptoms worsen or fail to improve.   Orders:  Orders Placed This Encounter  Procedures  . Hand/UE Inj: R elbow  . XR Elbow Complete Right (3+View)   No orders of the defined types were placed in this encounter.     Procedures: Hand/UE Inj: R elbow for lateral epicondylitis on 03/30/2018 8:41 AM Indications: pain Details: 22 G needle Medications: 1 mL lidocaine 1 %; 0.33 mL bupivacaine 0.25 %; 40 mg methylPREDNISolone acetate 40 MG/ML      Clinical Data: No additional findings.   Subjective: Chief Complaint  Patient presents with  . Right Elbow - Pain, Follow-up    HPI patient is a pleasant 63 year old Spanish-speaking female comes in today with right lateral elbow pain.  This is been ongoing for the past 3 weeks.  No known injury.  She does note that she did a lot of cooking and cleaning around the holidays.  Pain appears to be worse with lifting as well as supination of the forearm.  She has taken naproxen with mild relief of symptoms.  No radicular symptoms noted.  No previous injury to the elbow.  Review of Systems as detailed in HPI.  All others reviewed and are negative.   Objective: Vital Signs: There were no vitals taken for this visit.  Physical Exam well-developed and well-nourished female in no acute distress.  Alert and oriented x3.  Ortho Exam examination of her right  elbow reveals no swelling.  Marked tenderness over the lateral epicondyle.  No tenderness over the radial tunnel.  Increased pain with gripping and wrist extension.  She is neurovascularly intact distally.  Specialty Comments:  No specialty comments available.  Imaging: Xr Elbow Complete Right (3+view)  Result Date: 03/30/2018 No acute or structural abnormalities    PMFS History: Patient Active Problem List   Diagnosis Date Noted  . Lateral epicondylitis of right elbow 03/30/2018  . Right elbow pain 03/23/2018  . Rheumatoid arthritis involving multiple sites with positive rheumatoid factor ([place]) 12/15/2016  . Benign paroxysmal positional vertigo due to bilateral vestibular disorder 09/15/2016  . Elevated rheumatoid factor 05/08/2016  . Arthritis of left hip 05/05/2016  . Environmental allergies 05/05/2016  . Allergic rhinitis 11/21/2013  . Prediabetes 05/17/2013  . Language barrier, speaks Spanish only 02/21/2013  . Arthritis 02/14/2013   Past Medical History:  Diagnosis Date  . Allergy    seasonal  . Arthritis     Family History  Problem Relation Age of Onset  . Arthritis Mother   . Colon cancer Neg Hx     Past Surgical History:  Procedure Laterality Date  . TUBAL LIGATION     Social History   Occupational History  . Not on file  Tobacco Use  . Smoking status:  Never Smoker  . Smokeless tobacco: Never Used  Substance and Sexual Activity  . Alcohol use: No    Alcohol/week: 0.0 standard drinks  . Drug use: No  . Sexual activity: Not Currently    Birth control/protection: Surgical

## 2018-04-12 MED FILL — FLUTICASONE PROP 50 MCG SPR: 50 | 30 days supply | Qty: 16 | Fill #2

## 2018-04-20 ENCOUNTER — Encounter: Payer: Self-pay | Admitting: Internal Medicine

## 2018-04-20 ENCOUNTER — Ambulatory Visit: Payer: Self-pay | Attending: Internal Medicine | Admitting: Internal Medicine

## 2018-04-20 VITALS — BP 141/80 | HR 69 | Temp 97.6°F | Resp 16 | Wt 139.4 lb

## 2018-04-20 DIAGNOSIS — R7303 Prediabetes: Secondary | ICD-10-CM | POA: Insufficient documentation

## 2018-04-20 DIAGNOSIS — M0579 Rheumatoid arthritis with rheumatoid factor of multiple sites without organ or systems involvement: Secondary | ICD-10-CM | POA: Insufficient documentation

## 2018-04-20 DIAGNOSIS — Z791 Long term (current) use of non-steroidal anti-inflammatories (NSAID): Secondary | ICD-10-CM | POA: Insufficient documentation

## 2018-04-20 DIAGNOSIS — M7711 Lateral epicondylitis, right elbow: Secondary | ICD-10-CM | POA: Insufficient documentation

## 2018-04-20 DIAGNOSIS — Z79899 Other long term (current) drug therapy: Secondary | ICD-10-CM | POA: Insufficient documentation

## 2018-04-20 DIAGNOSIS — R490 Dysphonia: Secondary | ICD-10-CM | POA: Insufficient documentation

## 2018-04-20 DIAGNOSIS — M1612 Unilateral primary osteoarthritis, left hip: Secondary | ICD-10-CM | POA: Insufficient documentation

## 2018-04-20 DIAGNOSIS — R030 Elevated blood-pressure reading, without diagnosis of hypertension: Secondary | ICD-10-CM | POA: Insufficient documentation

## 2018-04-20 DIAGNOSIS — R058 Other specified cough: Secondary | ICD-10-CM | POA: Insufficient documentation

## 2018-04-20 DIAGNOSIS — J302 Other seasonal allergic rhinitis: Secondary | ICD-10-CM | POA: Insufficient documentation

## 2018-04-20 DIAGNOSIS — K219 Gastro-esophageal reflux disease without esophagitis: Secondary | ICD-10-CM | POA: Insufficient documentation

## 2018-04-20 DIAGNOSIS — R05 Cough: Secondary | ICD-10-CM | POA: Insufficient documentation

## 2018-04-20 MED ORDER — DICLOFENAC SODIUM 1 % TD GEL: 2.0000 g | Freq: Four times a day (QID) | TRANSDERMAL | 2 refills | Status: DC

## 2018-04-20 MED ORDER — FLUTICASONE PROPIONATE 50 MCG/ACT NA SUSP: 1.0000 | Freq: Every day | NASAL | 5 refills | Status: DC

## 2018-04-20 MED ORDER — LORATADINE 10 MG PO TABS: 10.0000 mg | ORAL_TABLET | Freq: Every day | ORAL | 11 refills | Status: DC

## 2018-04-20 MED FILL — DICLOFENAC SODIUM 1% GEL: 1 | 25 days supply | Qty: 100 | Fill #0

## 2018-04-20 MED FILL — FLUTICASONE PROP 50 MCG SPR: 50 | 25 days supply | Qty: 16 | Fill #0

## 2018-04-20 NOTE — Progress Notes (Signed)
Patient ID: Veronica Hart, female    DOB: [birth year]  MRN: [id number]  CC: Follow-up (3 month ) and Cough   Subjective: Veronica Hart is a 63 y.o. female who presents for chronic ds management Her concerns today include:  Patient with history of ? RA, GERD, prediabetes  Seen last mth for RT elbow pain. Referred to Dr. Erlinda Hong.  Dx with lateral epicodylitis.  Given steroid inj which helped. She wears tennis elbow strap.  Has sharp pains in the elbow when she is not wearing it.  Cough improved since being placed on Allegra and Flonase.   Five days ago, she started having itching throat and eyes, hoarseness, nasal drainage.  Not much sneezing.  Only thing that helps is OTC Alkaseltzer Sinus and Allergy Gel No fever.   Elev BP:  Not checking blood pressure regularly since last visit.   Walks 3 x a wk for exercise.  Patient Active Problem List   Diagnosis Date Noted  . Lateral epicondylitis of right elbow 03/30/2018  . Right elbow pain 03/23/2018  . Rheumatoid arthritis involving multiple sites with positive rheumatoid factor ([place]) 12/15/2016  . Benign paroxysmal positional vertigo due to bilateral vestibular disorder 09/15/2016  . Elevated rheumatoid factor 05/08/2016  . Arthritis of left hip 05/05/2016  . Environmental allergies 05/05/2016  . Allergic rhinitis 11/21/2013  . Prediabetes 05/17/2013  . Language barrier, speaks Spanish only 02/21/2013  . Arthritis 02/14/2013     Current Outpatient Medications on File Prior to Visit  Medication Sig Dispense Refill  . naproxen (NAPROSYN) 500 MG tablet Take 1 tablet (500 mg total) by mouth 2 (two) times daily as needed. 60 tablet 6  . omeprazole (PRILOSEC) 20 MG capsule Take 1 capsule (20 mg total) by mouth daily. 30 capsule 5   No current facility-administered medications on file prior to visit.     No Known Allergies  Social History   Socioeconomic History  . Marital status: Married    Spouse name: Not on file  .  Number of children: Not on file  . Years of education: Not on file  . Highest education level: Not on file  Occupational History  . Not on file  Social Needs  . Financial resource strain: Not on file  . Food insecurity:    Worry: Not on file    Inability: Not on file  . Transportation needs:    Medical: Not on file    Non-medical: Not on file  Tobacco Use  . Smoking status: Never Smoker  . Smokeless tobacco: Never Used  Substance and Sexual Activity  . Alcohol use: No    Alcohol/week: 0.0 standard drinks  . Drug use: No  . Sexual activity: Not Currently    Birth control/protection: Surgical  Lifestyle  . Physical activity:    Days per week: Not on file    Minutes per session: Not on file  . Stress: Not on file  Relationships  . Social connections:    Talks on phone: Not on file    Gets together: Not on file    Attends religious service: Not on file    Active member of club or organization: Not on file    Attends meetings of clubs or organizations: Not on file    Relationship status: Not on file  . Intimate partner violence:    Fear of current or ex partner: Not on file    Emotionally abused: Not on file    Physically abused: Not on file  Forced sexual activity: Not on file  Other Topics Concern  . Not on file  Social History Narrative  . Not on file    Family History  Problem Relation Age of Onset  . Arthritis Mother   . Colon cancer Neg Hx     Past Surgical History:  Procedure Laterality Date  . TUBAL LIGATION      ROS: Review of Systems Negative except as stated above  PHYSICAL EXAM: BP (!) 141/80   Pulse 69   Temp 97.6 F (36.4 C) (Oral)   Resp 16   Wt 139 lb 6.4 oz (63.2 kg)   SpO2 98%   BMI 28.16 kg/m   Wt Readings from Last 3 Encounters:  04/20/18 139 lb 6.4 oz (63.2 kg)  03/23/18 139 lb (63 kg)  01/12/18 141 lb 6.4 oz (64.1 kg)    Physical Exam  General appearance - alert, well appearing, and in no distress Mental status -  normal mood, behavior, speech, dress, motor activity, and thought processes Neck - supple, no significant adenopathy Nose -mild enlargement of nasal turbinates.  Clear mucus noted. Mouth -no oral lesions.  Throat is without erythema or exudates Chest - clear to auscultation, no wheezes, rales or rhonchi, symmetric air entry Heart - normal rate, regular rhythm, normal S1, S2, no murmurs, rubs, clicks or gallops Musculoskeletal -patient wearing elbow strap on the right elbow.  This was not removed. Extremities - peripheral pulses normal, no pedal edema, no clubbing or cyanosis   CMP Latest Ref Rng & Units 05/12/2017 06/23/2016 01/17/2013  Glucose 65 - 99 mg/dL 99 102(H) 90  BUN 8 - 27 mg/dL 13 16 20   Creatinine 0.57 - 1.00 mg/dL 0.74 0.69 0.67  Sodium 134 - 144 mmol/L 142 143 139  Potassium 3.5 - 5.2 mmol/L 4.1 4.1 4.4  Chloride 96 - 106 mmol/L 105 102 105  CO2 20 - 29 mmol/L 24 27 27   Calcium 8.7 - 10.3 mg/dL 9.5 9.2 9.1  Total Protein 6.0 - 8.5 g/dL 7.5 7.3 6.9  Total Bilirubin 0.0 - 1.2 mg/dL 0.3 0.3 0.3  Alkaline Phos 39 - 117 IU/L 100 91 88  AST 0 - 40 IU/L 27 21 19   ALT 0 - 32 IU/L 39(H) 19 20   Lipid Panel     Component Value Date/Time   CHOL 185 05/12/2017 1100   TRIG 148 05/12/2017 1100   HDL 45 05/12/2017 1100   CHOLHDL 4.1 05/12/2017 1100   CHOLHDL 3.5 11/21/2013 1749   VLDL 24 11/21/2013 1749   LDLCALC 110 (H) 05/12/2017 1100    CBC    Component Value Date/Time   WBC 6.4 05/12/2017 1100   WBC 5.7 05/05/2016 1007   RBC 4.35 05/12/2017 1100   RBC 4.56 05/05/2016 1007   HGB 13.6 05/12/2017 1100   HCT 38.9 05/12/2017 1100   PLT 290 05/12/2017 1100   MCV 89 05/12/2017 1100   MCH 31.3 05/12/2017 1100   MCH 29.8 05/05/2016 1007   MCHC 35.0 05/12/2017 1100   MCHC 33.3 05/05/2016 1007   RDW 14.2 05/12/2017 1100   LYMPHSABS 2.1 05/12/2017 1100   MONOABS 0.4 01/22/2010 2119   EOSABS 0.8 (H) 05/12/2017 1100   BASOSABS 0.1 05/12/2017 1100    ASSESSMENT AND  PLAN: 1. Elevated blood pressure reading BP likely more elevated today as she is on an over-the-counter cold and flu remedies that may contain Sudafed.  Encourage patient to check blood pressure once or twice a week and bring in  readings on next visit.  Encouraged to limit salt in the foods. - CBC - Comprehensive metabolic panel - Lipid panel  2. Seasonal allergic rhinitis, unspecified trigger Since she is still symptomatic on Allegra we discussed changing to a different allergy medication.  She is willing to try Claritin instead.  Continue Flonase. - loratadine (CLARITIN) 10 MG tablet; Take 1 tablet (10 mg total) by mouth daily.  Dispense: 30 tablet; Refill: 11 - fluticasone (FLONASE) 50 MCG/ACT nasal spray; Place 1 spray into both nostrils daily.  Dispense: 16 g; Refill: 5  3. Lateral epicondylitis of right elbow  - diclofenac sodium (VOLTAREN) 1 % GEL; Apply 2 g topically 4 (four) times daily.  Dispense: 100 g; Refill: 2  4. Upper airway cough syndrome - loratadine (CLARITIN) 10 MG tablet; Take 1 tablet (10 mg total) by mouth daily.  Dispense: 30 tablet; Refill: 11 - fluticasone (FLONASE) 50 MCG/ACT nasal spray; Place 1 spray into both nostrils daily.  Dispense: 16 g; Refill: 5   Patient was given the opportunity to ask questions.  Patient verbalized understanding of the plan and was able to repeat key elements of the plan.  [employer] interpreter used during this encounter. #[id number]   Orders Placed This Encounter  Procedures  . CBC  . Comprehensive metabolic panel  . Lipid panel     Requested Prescriptions   Signed Prescriptions Disp Refills  . loratadine (CLARITIN) 10 MG tablet 30 tablet 11    Sig: Take 1 tablet (10 mg total) by mouth daily.  . diclofenac sodium (VOLTAREN) 1 % GEL 100 g 2    Sig: Apply 2 g topically 4 (four) times daily.  . fluticasone (FLONASE) 50 MCG/ACT nasal spray 16 g 5    Sig: Place 1 spray into both nostrils daily.    Return in about 3 months  (around 07/19/2018).  Karle Plumber, MD, FACP

## 2018-04-22 LAB — CBC
Hematocrit: 40.4 % (ref 34.0–46.6)
Hemoglobin: 13.6 g/dL (ref 11.1–15.9)
MCH: 30.3 pg (ref 26.6–33.0)
MCHC: 33.7 g/dL (ref 31.5–35.7)
MCV: 90 fL (ref 79–97)
Platelets: 283 10*3/uL (ref 150–450)
RBC: 4.49 x10E6/uL (ref 3.77–5.28)
RDW: 13 % (ref 11.7–15.4)
WBC: 5.6 10*3/uL (ref 3.4–10.8)

## 2018-04-22 LAB — COMPREHENSIVE METABOLIC PANEL
ALT: 37 IU/L — ABNORMAL HIGH (ref 0–32)
AST: 29 IU/L (ref 0–40)
Albumin/Globulin Ratio: 1.5 (ref 1.2–2.2)
Albumin: 4.4 g/dL (ref 3.8–4.8)
Alkaline Phosphatase: 103 IU/L (ref 39–117)
BUN/Creatinine Ratio: 23 (ref 12–28)
BUN: 15 mg/dL (ref 8–27)
Bilirubin Total: 0.2 mg/dL (ref 0.0–1.2)
CO2: 24 mmol/L (ref 20–29)
Calcium: 9.4 mg/dL (ref 8.7–10.3)
Chloride: 101 mmol/L (ref 96–106)
Creatinine, Ser: 0.64 mg/dL (ref 0.57–1.00)
GFR calc Af Amer: 111 mL/min/{1.73_m2} (ref >59)
GFR, EST NON AFRICAN AMERICAN: 97 mL/min/{1.73_m2} (ref >59)
Globulin, Total: 2.9 g/dL (ref 1.5–4.5)
Glucose: 83 mg/dL (ref 65–99)
Potassium: 4.3 mmol/L (ref 3.5–5.2)
Sodium: 142 mmol/L (ref 134–144)
Total Protein: 7.3 g/dL (ref 6.0–8.5)

## 2018-04-22 LAB — LIPID PANEL
CHOL/HDL RATIO: 3.7 ratio (ref 0.0–4.4)
Cholesterol, Total: 182 mg/dL (ref 100–199)
HDL: 49 mg/dL (ref >39)
LDL Calculated: 106 mg/dL — ABNORMAL HIGH (ref 0–99)
Triglycerides: 134 mg/dL (ref 0–149)
VLDL Cholesterol Cal: 27 mg/dL (ref 5–40)

## 2018-05-11 ENCOUNTER — Telehealth: Payer: Self-pay

## 2018-05-11 NOTE — Telephone Encounter (Signed)
[employer] interpreters Thurman Coyer Id#  [phone] contacted pt to go over lab results pt is aware of results and doesn't have any questions or concerns  Also provideds pt Mr. Veronica Hart results as well and [name] is scheduled to see Dr. Wynetta Emery next week for Trulicity

## 2018-07-19 ENCOUNTER — Ambulatory Visit: Payer: Self-pay | Attending: Internal Medicine | Admitting: Internal Medicine

## 2018-07-19 ENCOUNTER — Encounter: Payer: Self-pay | Admitting: Internal Medicine

## 2018-07-19 ENCOUNTER — Other Ambulatory Visit: Payer: Self-pay

## 2018-07-19 DIAGNOSIS — R058 Other specified cough: Secondary | ICD-10-CM

## 2018-07-19 DIAGNOSIS — J302 Other seasonal allergic rhinitis: Secondary | ICD-10-CM

## 2018-07-19 DIAGNOSIS — R05 Cough: Secondary | ICD-10-CM

## 2018-07-19 DIAGNOSIS — M130 Polyarthritis, unspecified: Secondary | ICD-10-CM

## 2018-07-19 MED ORDER — CELECOXIB 200 MG PO CAPS: 200.0000 mg | ORAL_CAPSULE | Freq: Every day | ORAL | 2 refills | Status: DC

## 2018-07-19 MED FILL — CELECOXIB 200 MG CAPSULE: 200 | 30 days supply | Qty: 30 | Fill #0

## 2018-07-19 NOTE — Progress Notes (Signed)
Pt stated this morning she noticed her back is starting to hurt

## 2018-07-19 NOTE — Progress Notes (Signed)
Virtual Visit via Telephone Note Due to current restrictions/limitations of in-office visits due to the COVID-19 pandemic, this scheduled clinical appointment was converted to a telehealth visit  I connected with Veronica Hart on 07/19/18 at 10:21 a.m EDT by telephone and verified that I am speaking with the correct person using two identifiers. I am in my office.  The patient is at home.  Only the patient, myself and  Interpreter from [employer]  [phone]) participated in this encounter.    I discussed the limitations, risks, security and privacy concerns of performing an evaluation and management service by telephone and the availability of in person appointments. I also discussed with the patient that there may be a patient responsible charge related to this service. The patient expressed understanding and agreed to proceed.   History of Present Illness: Patient with history of?RA, GERD, prediabetes, UACS/allergies, HL.  Last seen 04/2018.  Pt reports pain in bones in hands RT>LT, RT hip and lower back getting worse. Hands very stiff in mornings; stiffness last several mins, better once she gets moving.  Some swelling in jts of LT hand.  Pain in RT hip and lower back also get better when she gets moving.  Uses Naprosyn about 4 x a wk which helps.  Last x-ray lumbar spine 7/201: IMPRESSION: 1. No acute osseous abnormality. 2. Lumbar degenerative changes greatest at the L3-4 through L5-S1 levels where disc and facet disease contributes to mild bilateral foraminal stenosis. No significant canal stenosis. 3. L3-4 lateral recess narrowing with disc contact on descending L4 nerve roots bilaterally.  X-rays of hands:  Done 05/2017 revealed no erosive disease  UACS/allergies:  Doing well on Claritin and Flonase.  Requests refills.  Observations/Objective: No direct observation done as this was a telephone encounter.  Assessment and Plan: 1. Polyarthritis Patient had mild  elevation in rheumatoid factor in the past and we tried to get her to a rheumatologist but patient has transportation issues. Current symptoms however suggest OA given that the morning stiffness improves within several minutes.  Advised to stop Naprosyn.  Will place her on Celebrex instead.  Encourage regular exercise - celecoxib (CELEBREX) 200 MG capsule; Take 1 capsule (200 mg total) by mouth daily.  Dispense: 30 capsule; Refill: 2  2. Upper airway cough syndrome 3. Seasonal allergic rhinitis, unspecified trigger Refill sent on Claritin and Flonase  Follow Up Instructions: Follow-up in 3 months   I discussed the assessment and treatment plan with the patient. The patient was provided an opportunity to ask questions and all were answered. The patient agreed with the plan and demonstrated an understanding of the instructions.   The patient was advised to call back or seek an in-person evaluation if the symptoms worsen or if the condition fails to improve as anticipated.  I provided 14 minutes of non-face-to-face time during this encounter.   Karle Plumber, MD

## 2018-08-17 MED FILL — OMEPRAZOLE 20 MG CAP: 20 | 30 days supply | Qty: 30 | Fill #3

## 2018-08-17 MED FILL — FLUTICASONE PROP 50 MCG SPR: 50 | 25 days supply | Qty: 16 | Fill #1

## 2018-08-17 MED FILL — !CELEBREX 200MG CAPSULE: 200 MG | 30 days supply | Qty: 30 | Fill #1

## 2018-09-14 ENCOUNTER — Other Ambulatory Visit: Payer: Self-pay

## 2018-09-14 ENCOUNTER — Ambulatory Visit: Payer: Self-pay | Attending: Internal Medicine

## 2018-10-11 ENCOUNTER — Ambulatory Visit: Payer: Self-pay | Admitting: Internal Medicine

## 2018-10-11 ENCOUNTER — Other Ambulatory Visit: Payer: Self-pay

## 2018-10-25 MED FILL — DICLOFENAC SODIUM 1% GEL: 1 | 25 days supply | Qty: 100 | Fill #1

## 2018-10-25 MED FILL — !CELEBREX 200MG CAPSULE: 200 MG | 30 days supply | Qty: 30 | Fill #2

## 2018-10-25 MED FILL — FLUTICASONE PROP 50 MCG SPR: 50 | 25 days supply | Qty: 16 | Fill #2

## 2018-11-09 ENCOUNTER — Ambulatory Visit: Payer: Self-pay | Admitting: Internal Medicine

## 2018-12-27 ENCOUNTER — Other Ambulatory Visit: Payer: Self-pay | Admitting: Internal Medicine

## 2018-12-27 DIAGNOSIS — K219 Gastro-esophageal reflux disease without esophagitis: Secondary | ICD-10-CM

## 2018-12-27 MED FILL — FLUTICASONE PROP 50 MCG SPR: 50 | 25 days supply | Qty: 16 | Fill #3

## 2018-12-28 ENCOUNTER — Other Ambulatory Visit: Payer: Self-pay

## 2018-12-28 ENCOUNTER — Ambulatory Visit: Payer: Self-pay | Attending: Family Medicine | Admitting: Pharmacist

## 2018-12-28 DIAGNOSIS — Z23 Encounter for immunization: Secondary | ICD-10-CM

## 2018-12-28 MED FILL — ?OMEPRAZOLE 20MG CAP DR: 20 | 30 days supply | Qty: 30 | Fill #0

## 2018-12-28 NOTE — Progress Notes (Signed)
Patient presents for vaccination against influenza per orders of Dr. Johnson. Consent given. Counseling provided. No contraindications exists. Vaccine administered without incident.   

## 2019-02-07 ENCOUNTER — Ambulatory Visit: Payer: Self-pay | Attending: Internal Medicine | Admitting: Internal Medicine

## 2019-02-07 ENCOUNTER — Telehealth: Payer: Self-pay | Admitting: Internal Medicine

## 2019-02-07 ENCOUNTER — Encounter: Payer: Self-pay | Admitting: Internal Medicine

## 2019-02-07 ENCOUNTER — Other Ambulatory Visit: Payer: Self-pay

## 2019-02-07 VITALS — BP 147/71 | HR 80 | Temp 98.4°F | Resp 16 | Wt 145.8 lb

## 2019-02-07 DIAGNOSIS — G8929 Other chronic pain: Secondary | ICD-10-CM

## 2019-02-07 DIAGNOSIS — M542 Cervicalgia: Secondary | ICD-10-CM

## 2019-02-07 DIAGNOSIS — J3089 Other allergic rhinitis: Secondary | ICD-10-CM

## 2019-02-07 DIAGNOSIS — M130 Polyarthritis, unspecified: Secondary | ICD-10-CM

## 2019-02-07 DIAGNOSIS — M546 Pain in thoracic spine: Secondary | ICD-10-CM

## 2019-02-07 DIAGNOSIS — R030 Elevated blood-pressure reading, without diagnosis of hypertension: Secondary | ICD-10-CM

## 2019-02-07 NOTE — Telephone Encounter (Signed)
Pt was given a copy of the [employer] letter in person

## 2019-02-07 NOTE — Patient Instructions (Signed)
Please give patient an appointment with the clinical pharmacist in 2 weeks for repeat blood pressure check.

## 2019-02-07 NOTE — Telephone Encounter (Signed)
Patient came in requesting her [place] letter. Patient states she was approved but never received the letter in the mail. Please f/u

## 2019-02-07 NOTE — Progress Notes (Signed)
Patient ID: Veronica Hart, female    DOB: 07/03/1955  MRN: [id number]  CC: Follow-up   Subjective: Veronica Hart is a 63 y.o. female who presents for chronic ds management.  Husband is with her Her concerns today include:  Patient with history of?RA, GERD, prediabetes, UACS/allergies, HL  C/o having a lot of pain b/w the shoulder blades x 3 mths. Worse when she is moving around at work.  Works at [employer] away "when [employer]  like sitting down.  No radiation, numbness or tingling Stopped taking Celebrex.  Finds Naprosyn helps better with pain in hands.  Takes it 1-3 x a wk.   Pain in neck intermittenly also x 3 mths Notice that her nails break very easily x 2 mths.   On last visit, she c/o pain in bones in hands RT>LT, RT hip and lower backgetting worse. Hands very stiff in mornings; stiffness last several mins, better once she gets moving.  Some swelling in jts of LT hand.  I have placed her on Celebrex but she discontinued taking that and is taking Naprosyn instead which she feels works better.  Anti-CCP have been normal.  She has had mild elevation in rheumatoid factor in the past  C/o allergy symptoms since last wgh +Itchy eyes, runny nose.  Little sneezing and dry cough. No sick contacts.  No fever or SOB.  Using [place] which helps.  Uses Flonase daily.   Patient Active Problem List   Diagnosis Date Noted  . Upper airway cough syndrome 04/20/2018  . Lateral epicondylitis of right elbow 03/30/2018  . Right elbow pain 03/23/2018  . Rheumatoid arthritis involving multiple sites with positive rheumatoid factor ([place]) 12/15/2016  . Benign paroxysmal positional vertigo due to bilateral vestibular disorder 09/15/2016  . Elevated rheumatoid factor 05/08/2016  . Arthritis of left hip 05/05/2016  . Environmental allergies 05/05/2016  . Allergic rhinitis 11/21/2013  . Prediabetes 05/17/2013  . Language barrier, speaks Spanish only 02/21/2013  . Arthritis  02/14/2013     Current Outpatient Medications on File Prior to Visit  Medication Sig Dispense Refill  . celecoxib (CELEBREX) 200 MG capsule Take 1 capsule (200 mg total) by mouth daily. 30 capsule 2  . diclofenac sodium (VOLTAREN) 1 % GEL Apply 2 g topically 4 (four) times daily. 100 g 2  . fluticasone (FLONASE) 50 MCG/ACT nasal spray Place 1 spray into both nostrils daily. 16 g 5  . loratadine (CLARITIN) 10 MG tablet Take 1 tablet (10 mg total) by mouth daily. 30 tablet 11  . omeprazole (PRILOSEC) 20 MG capsule TAKE 1 CAPSULE BY MOUTH DAILY. 30 capsule 0   No current facility-administered medications on file prior to visit.     No Known Allergies  Social History   Socioeconomic History  . Marital status: Married    Spouse name: Not on file  . Number of children: Not on file  . Years of education: Not on file  . Highest education level: Not on file  Occupational History  . Not on file  Social Needs  . Financial resource strain: Not on file  . Food insecurity    Worry: Not on file    Inability: Not on file  . Transportation needs    Medical: Not on file    Non-medical: Not on file  Tobacco Use  . Smoking status: Never Smoker  . Smokeless tobacco: Never Used  Substance and Sexual Activity  . Alcohol use: No    Alcohol/week: 0.0 standard  drinks  . Drug use: No  . Sexual activity: Not Currently    Birth control/protection: Surgical  Lifestyle  . Physical activity    Days per week: Not on file    Minutes per session: Not on file  . Stress: Not on file  Relationships  . Social Herbalist on phone: Not on file    Gets together: Not on file    Attends religious service: Not on file    Active member of club or organization: Not on file    Attends meetings of clubs or organizations: Not on file    Relationship status: Not on file  . Intimate partner violence    Fear of current or ex partner: Not on file    Emotionally abused: Not on file    Physically  abused: Not on file    Forced sexual activity: Not on file  Other Topics Concern  . Not on file  Social History Narrative  . Not on file    Family History  Problem Relation Age of Onset  . Arthritis Mother   . Colon cancer Neg Hx     Past Surgical History:  Procedure Laterality Date  . TUBAL LIGATION      ROS: Review of Systems Negative except as stated above  PHYSICAL EXAM: BP (!) 147/71   Pulse 80   Temp 98.4 F (36.9 C) (Oral)   Resp 16   Wt 145 lb 12.8 oz (66.1 kg)   SpO2 97%   BMI 29.45 kg/m   BP 150/90 Physical Exam  General appearance - alert, well appearing, and in no distress Mental status - normal mood, behavior, speech, dress, motor activity, and thought processes Mouth - mucous membranes moist, pharynx normal without lesions.  Throat is clear Nose: Mild enlargement of nasal turbinates Neck - supple, no significant adenopathy Chest - clear to auscultation, no wheezes, rales or rhonchi, symmetric air entry Heart - normal rate, regular rhythm, normal S1, S2, no murmurs, rubs, clicks or gallops Extremities - peripheral pulses normal, no pedal edema, no clubbing or cyanosis MSK: Hands are without signs of active tenosynovitis. No tenderness on palpation of the thoracic spine.  No tenderness on palpation of the cervical spine.  Neck is supple. CMP Latest Ref Rng & Units 04/20/2018 05/12/2017 06/23/2016  Glucose 65 - 99 mg/dL 83 99 102(H)  BUN 8 - 27 mg/dL 15 13 16   Creatinine 0.57 - 1.00 mg/dL 0.64 0.74 0.69  Sodium 134 - 144 mmol/L 142 142 143  Potassium 3.5 - 5.2 mmol/L 4.3 4.1 4.1  Chloride 96 - 106 mmol/L 101 105 102  CO2 20 - 29 mmol/L 24 24 27   Calcium 8.7 - 10.3 mg/dL 9.4 9.5 9.2  Total Protein 6.0 - 8.5 g/dL 7.3 7.5 7.3  Total Bilirubin 0.0 - 1.2 mg/dL 0.2 0.3 0.3  Alkaline Phos 39 - 117 IU/L 103 100 91  AST 0 - 40 IU/L 29 27 21   ALT 0 - 32 IU/L 37(H) 39(H) 19   Lipid Panel     Component Value Date/Time   CHOL 182 04/20/2018 1132   TRIG 134  04/20/2018 1132   HDL 49 04/20/2018 1132   CHOLHDL 3.7 04/20/2018 1132   CHOLHDL 3.5 11/21/2013 1749   VLDL 24 11/21/2013 1749   LDLCALC 106 (H) 04/20/2018 1132    CBC    Component Value Date/Time   WBC 5.6 04/20/2018 1132   WBC 5.7 05/05/2016 1007   RBC 4.49 04/20/2018 1132  RBC 4.56 05/05/2016 1007   HGB 13.6 04/20/2018 1132   HCT 40.4 04/20/2018 1132   PLT 283 04/20/2018 1132   MCV 90 04/20/2018 1132   MCH 30.3 04/20/2018 1132   MCH 29.8 05/05/2016 1007   MCHC 33.7 04/20/2018 1132   MCHC 33.3 05/05/2016 1007   RDW 13.0 04/20/2018 1132   LYMPHSABS 2.1 05/12/2017 1100   MONOABS 0.4 01/22/2010 2119   EOSABS 0.8 (H) 05/12/2017 1100   BASOSABS 0.1 05/12/2017 1100    ASSESSMENT AND PLAN: 1. Chronic midline thoracic back pain We will get x-rays of the thoracic and cervical spine. She can continue Naprosyn as needed.  I have removed Celebrex from the med list. - DG Thoracic Spine W/Swimmers; Future  2. Non-seasonal allergic rhinitis, unspecified trigger Recommend trying Zyrtec or Allegra over-the-counter.  3. Polyarthritis -We will refer to rheumatology to clarify whether this is rheumatoid arthritis or not - Ambulatory referral to Rheumatology  4. Elevated blood pressure reading Patient on Naprosyn which could elevate blood pressure but she is not taking it often enough I feel to have this effect on her blood pressure. DASH diet discussed and encouraged.  We will have her follow-up with the clinical pharmacist in about 2 weeks.  If blood pressure still elevated I would recommend starting low-dose of amlodipine.  5. Neck pain - DG Cervical Spine Complete; Future   Patient was given the opportunity to ask questions.  Patient verbalized understanding of the plan and was able to repeat key elements of the plan.   No orders of the defined types were placed in this encounter.    Requested Prescriptions    No prescriptions requested or ordered in this encounter     No follow-ups on file.  Karle Plumber, MD, FACP

## 2019-02-16 MED FILL — DICLOFENAC SODIUM 1% GEL: 1 | 25 days supply | Qty: 100 | Fill #2

## 2019-02-16 MED FILL — FLUTICASONE PROP 50 MCG SPR: 50 | 25 days supply | Qty: 16 | Fill #4

## 2019-02-21 ENCOUNTER — Encounter: Payer: Self-pay | Admitting: Pharmacist

## 2019-02-21 ENCOUNTER — Ambulatory Visit: Payer: Self-pay | Attending: Internal Medicine | Admitting: Pharmacist

## 2019-02-21 ENCOUNTER — Other Ambulatory Visit: Payer: Self-pay

## 2019-02-21 VITALS — BP 153/79 | HR 80

## 2019-02-21 DIAGNOSIS — R030 Elevated blood-pressure reading, without diagnosis of hypertension: Secondary | ICD-10-CM

## 2019-02-21 MED ORDER — AMLODIPINE BESYLATE 5 MG PO TABS: 5.0000 mg | ORAL_TABLET | Freq: Every day | ORAL | 2 refills | Status: DC

## 2019-02-21 MED FILL — AMLODIPINE BESYLATE 5 MG TA: 5 | 30 days supply | Qty: 30 | Fill #0

## 2019-02-21 NOTE — Patient Instructions (Signed)
Thank you for coming to see [place] today.   Blood pressure today is elevated.   Start taking amlodipine 5 mg daily. You'll take 1 tablet once a day.   Limiting salt and caffeine, as well as exercising as able for at least 30 minutes for 5 days out of the week, can also help you lower your blood pressure.  Take your blood pressure at home if you are able. Please write down these numbers and bring them to your visits.  If you have any questions about medications, please call me [phone].  Lurena Joiner

## 2019-02-21 NOTE — Progress Notes (Signed)
   S:    PCP: Dr. Wynetta Emery  Patient arrives in good spirits.    Presents to the clinic for hypertension evaluation, counseling, and management.  Patient was referred and last seen by Primary Care Provider on 02/07/19. BP was 147/71 - DASH diet discussed.     Patient does not take medications for BP. Denies chest pain, dyspnea, HA or blurred vision. Endorses occasional dizziness but this is related to her vertigo.   Current BP Medications include:  None   Antihypertensives tried in the past include: none, NKDA  Dietary habits include: pt is limiting salt; does drink coffee daily  Exercise habits include: "a little bit" - pt walks occasionally  Family / Social history:  - FHx: no pertinent positives - Tobacco: never smoker - Alcohol: denies   O:  Vitals:   02/21/19 1123  BP: (!) 153/79  Pulse: 80   Home BP readings: does not check at home  Last 3 Office BP readings: BP Readings from Last 3 Encounters:  02/07/19 (!) 147/71  04/20/18 (!) 141/80  03/23/18 125/77   BMET    Component Value Date/Time   NA 142 04/20/2018 1132   K 4.3 04/20/2018 1132   CL 101 04/20/2018 1132   CO2 24 04/20/2018 1132   GLUCOSE 83 04/20/2018 1132   GLUCOSE 90 01/17/2013 0855   BUN 15 04/20/2018 1132   CREATININE 0.64 04/20/2018 1132   CREATININE 0.67 01/17/2013 0855   CALCIUM 9.4 04/20/2018 1132   GFRNONAA 97 04/20/2018 1132   GFRNONAA >89 01/17/2013 0855   GFRAA 111 04/20/2018 1132   GFRAA >89 01/17/2013 0855   Renal function: CrCl cannot be calculated (Patient's most recent lab result is older than the maximum 21 days allowed.).  Clinical ASCVD: No  The 10-year ASCVD risk score Mikey Bussing DC Jr., et al., 2013) is: 5.3%   Values used to calculate the score:     Age: 63 years     Sex: Female     Is Non-Hispanic African American: No     Diabetic: No     Tobacco smoker: No     Systolic Blood Pressure: [id number] mmHg     Is BP treated: No     HDL Cholesterol: 49 mg/dL     Total Cholesterol: 182  mg/dL  A/P: Hypertension newly dx. BP Goal = <130/80 mmHg. Patient was seen by Dr. Wynetta Emery last month. Dr. Wynetta Emery recommended that pt start amlodipine if her BP is elevated. Will send in for amlodipine 5 mg daily and have her return in 1 month for BP check.  -Started amlodipine 5 mg daily.  -Counseled on lifestyle modifications for blood pressure control including reduced dietary sodium, increased exercise, adequate sleep  Results reviewed and written information provided.   Total time in face-to-face counseling 15 minutes.   F/U Clinic Visit in 1 month with me for recheck.  Benard Halsted, PharmD, [place] [phone]

## 2019-03-25 MED FILL — AMLODIPINE BESYLATE 5 MG TA: 5 | 30 days supply | Qty: 30 | Fill #1

## 2019-04-13 ENCOUNTER — Ambulatory Visit: Payer: Self-pay | Attending: Internal Medicine

## 2019-04-13 ENCOUNTER — Other Ambulatory Visit: Payer: Self-pay

## 2019-05-09 MED FILL — AMLODIPINE BESYLATE 5 MG TA: 5 | 30 days supply | Qty: 30 | Fill #2

## 2019-05-10 ENCOUNTER — Ambulatory Visit: Payer: Self-pay | Admitting: Internal Medicine

## 2019-05-10 ENCOUNTER — Other Ambulatory Visit: Payer: Self-pay

## 2019-05-10 ENCOUNTER — Ambulatory Visit: Payer: Self-pay | Attending: Internal Medicine | Admitting: Internal Medicine

## 2019-05-10 DIAGNOSIS — I10 Essential (primary) hypertension: Secondary | ICD-10-CM

## 2019-05-10 DIAGNOSIS — R7303 Prediabetes: Secondary | ICD-10-CM

## 2019-05-10 DIAGNOSIS — Z1231 Encounter for screening mammogram for malignant neoplasm of breast: Secondary | ICD-10-CM

## 2019-05-10 DIAGNOSIS — R42 Dizziness and giddiness: Secondary | ICD-10-CM

## 2019-05-10 DIAGNOSIS — M130 Polyarthritis, unspecified: Secondary | ICD-10-CM

## 2019-05-10 DIAGNOSIS — M542 Cervicalgia: Secondary | ICD-10-CM

## 2019-05-10 MED ORDER — MECLIZINE HCL 12.5 MG PO TABS: 12.5000 mg | ORAL_TABLET | Freq: Every day | ORAL | 0 refills | Status: AC | PRN

## 2019-05-10 NOTE — Progress Notes (Signed)
Virtual Visit via Telephone Note Due to current restrictions/limitations of in-office visits due to the COVID-19 pandemic, this scheduled clinical appointment was converted to a telehealth visit  I connected with Veronica Hart on 05/10/19 at 2:41 p.m by telephone and verified that I am speaking with the correct person using two identifiers. I am in my office.  The patient is at home.  Only the patient, myself and Rockney Ghee from [facility] interpreters [phone]) participated in this encounter.  I discussed the limitations, risks, security and privacy concerns of performing an evaluation and management service by telephone and the availability of in person appointments. I also discussed with the patient that there may be a patient responsible charge related to this service. The patient expressed understanding and agreed to proceed.   History of Present Illness: Patient with history of?RA, GERD, prediabetes, UACS/allergies, HL, HTN.  Patient last seen 01/2019.  C/o ongoing "pain in the bones of my hands."  Also still having pain in neck.  Taking naprosyn PRN.  Rheumatoid factor has been elevated in the past.  Anti-CCP has been normal.  I ordered x-ray of neck on last visit but she did not have it done as OC had expired. She plans to have it done now that she has renewed the OC  -referred to rheumatologist on last visit but Dr. Estanislado Pandy denied the referral as pt was uninsured.  She was sent info with options to go to [facility] and to apply for financial assistance there.    She complains of intermittent dizziness x 1.5 yr. She has been experiencing it for the past 1 wk.  Occurs when she bends down, turns her head fast  and when she lays down.  "When I lay down, I feel like I'm floating."  No dizziness when she turns in bed.  Episodes last about 1 minute. No ringing or decreased hearing.  No falls, N/V.   HTN:  Since last visit with me, she saw clin pharmacist 02/2019 and was started on Norvasc 5 mg Reports  compliance. Checks BP 2-3 x a wk. BP this morning was 130/90.  Reports SBP has been 117-120 but she does not recall the DPs  No CP, SOB or LE edema  Outpatient Encounter Medications as of 05/10/2019  Medication Sig  . amLODipine (NORVASC) 5 MG tablet Take 1 tablet (5 mg total) by mouth daily.  . diclofenac sodium (VOLTAREN) 1 % GEL Apply 2 g topically 4 (four) times daily.  . fluticasone (FLONASE) 50 MCG/ACT nasal spray Place 1 spray into both nostrils daily.  Marland Kitchen omeprazole (PRILOSEC) 20 MG capsule TAKE 1 CAPSULE BY MOUTH DAILY.   No facility-administered encounter medications on file as of 05/10/2019.      Observations/Objective: Results for orders placed or performed in visit on 04/20/18  CBC  Result Value Ref Range   WBC 5.6 3.4 - 10.8 x10E3/uL   RBC 4.49 3.77 - 5.28 x10E6/uL   Hemoglobin 13.6 11.1 - 15.9 g/dL   Hematocrit 40.4 34.0 - 46.6 %   MCV 90 79 - 97 fL   MCH 30.3 26.6 - 33.0 pg   MCHC 33.7 31.5 - 35.7 g/dL   RDW 13.0 11.7 - 15.4 %   Platelets 283 150 - 450 x10E3/uL  Comprehensive metabolic panel  Result Value Ref Range   Glucose 83 65 - 99 mg/dL   BUN 15 8 - 27 mg/dL   Creatinine, Ser 0.64 0.57 - 1.00 mg/dL   GFR calc non Af Amer 97 >59 mL/min/1.73  GFR calc Af Amer 111 >59 mL/min/1.73   BUN/Creatinine Ratio 23 12 - 28   Sodium 142 134 - 144 mmol/L   Potassium 4.3 3.5 - 5.2 mmol/L   Chloride 101 96 - 106 mmol/L   CO2 24 20 - 29 mmol/L   Calcium 9.4 8.7 - 10.3 mg/dL   Total Protein 7.3 6.0 - 8.5 g/dL   Albumin 4.4 3.8 - 4.8 g/dL   Globulin, Total 2.9 1.5 - 4.5 g/dL   Albumin/Globulin Ratio 1.5 1.2 - 2.2   Bilirubin Total 0.2 0.0 - 1.2 mg/dL   Alkaline Phosphatase 103 39 - 117 IU/L   AST 29 0 - 40 IU/L   ALT 37 (H) 0 - 32 IU/L  Lipid panel  Result Value Ref Range   Cholesterol, Total 182 100 - 199 mg/dL   Triglycerides 134 0 - 149 mg/dL   HDL 49 >39 mg/dL   VLDL Cholesterol Cal 27 5 - 40 mg/dL   LDL Calculated 106 (H) 0 - 99 mg/dL   Chol/HDL Ratio 3.7 0.0  - 4.4 ratio     Assessment and Plan: 1. Dizziness Likely BPV Advised patient to go slow with position changes. Trial of meclizine. Follow-up if no improvement - CBC; Future - Comprehensive metabolic panel; Future - meclizine (ANTIVERT) 12.5 MG tablet; Take 1 tablet (12.5 mg total) by mouth daily as needed for dizziness.  Dispense: 30 tablet; Refill: 0  2. Essential hypertension Advised patient to check blood pressure at least twice a week and write down the numbers.  Goal is 130/80 or lower.  Follow-up with me in 1 month for recheck - Lipid panel; Future  3. Polyarthritis Patient has had ongoing issues with pain in the small joints of the hands.  She has had mild elevation in rheumatoid factor level.  Now that she is been approved for the [place] card, will try to refer her to the rheumatologist again. - Ambulatory referral to Rheumatology - Rheumatoid factor; Future - CYCLIC CITRUL PEPTIDE ANTIBODY, IGG/IGA; Future  4. Neck pain Patient will go to radiology to have the x-rays done as was ordered on last visit  5. Encounter for screening mammogram for malignant neoplasm of breast - MM Digital Screening; Future  6. Prediabetes - Hemoglobin A1c; Future   Follow Up Instructions: 1 mth   I discussed the assessment and treatment plan with the patient. The patient was provided an opportunity to ask questions and all were answered. The patient agreed with the plan and demonstrated an understanding of the instructions.   The patient was advised to call back or seek an in-person evaluation if the symptoms worsen or if the condition fails to improve as anticipated.  I provided 23 minutes of non-face-to-face time during this encounter.   Karle Plumber, MD

## 2019-05-10 NOTE — Progress Notes (Signed)
Pt states when she bends down or lay down she feels dizzy

## 2019-05-11 ENCOUNTER — Telehealth: Payer: Self-pay | Admitting: Internal Medicine

## 2019-05-11 MED FILL — ?MECLIZINE 12.5 MG TABL: 12.5 | 30 days supply | Qty: 30 | Fill #0

## 2019-05-11 NOTE — Telephone Encounter (Signed)
-----   Message from Ena Dawley sent at 05/11/2019  8:42 AM EST ----- Regarding: Rheumatology Referral Gm  Dr  Carvel Getting card don't cover    On  02/2019 you place the same referral    Dr  Estanislado Pandy Denied the referral   Pt don't have insurance I send her a letter with the options to go to [facility] Rheumatology or [facility] Internal Medicine [address] ph. # [id number] [id number] and apply for financial assistant  [phone] Waiting for patient to answer.  I spoke to patient on 02/2019  and she said that she won't  do it right now  .

## 2019-05-16 ENCOUNTER — Telehealth: Payer: Self-pay | Admitting: Internal Medicine

## 2019-05-16 ENCOUNTER — Ambulatory Visit: Payer: Self-pay | Attending: Internal Medicine

## 2019-05-16 ENCOUNTER — Ambulatory Visit (HOSPITAL_COMMUNITY)
Admission: RE | Admit: 2019-05-16 | Discharge: 2019-05-16 | Disposition: A | Discharge status: Home / Self Care | Payer: Self-pay | Source: Ambulatory Visit | Attending: Internal Medicine | Admitting: Internal Medicine

## 2019-05-16 ENCOUNTER — Other Ambulatory Visit: Payer: Self-pay

## 2019-05-16 DIAGNOSIS — M130 Polyarthritis, unspecified: Secondary | ICD-10-CM

## 2019-05-16 DIAGNOSIS — G8929 Other chronic pain: Secondary | ICD-10-CM | POA: Insufficient documentation

## 2019-05-16 DIAGNOSIS — M546 Pain in thoracic spine: Secondary | ICD-10-CM | POA: Insufficient documentation

## 2019-05-16 DIAGNOSIS — M542 Cervicalgia: Secondary | ICD-10-CM | POA: Insufficient documentation

## 2019-05-16 DIAGNOSIS — I10 Essential (primary) hypertension: Secondary | ICD-10-CM

## 2019-05-16 DIAGNOSIS — R7303 Prediabetes: Secondary | ICD-10-CM

## 2019-05-16 DIAGNOSIS — R42 Dizziness and giddiness: Secondary | ICD-10-CM

## 2019-05-16 NOTE — Telephone Encounter (Signed)
-----   Message from Ena Dawley sent at 05/16/2019 12:35 PM EST ----- Regarding: RE: Rheumatology Referral I spoke to Mrs  sosa and she wants me to send the referral to  [place] [facility] of [facility]  . I will do that .  ----- Message ----- From: Ladell Pier, MD Sent: 05/11/2019   9:34 AM EST To: Ena Dawley Subject: RE: Rheumatology Referral                      Could you call her and remind her of this.  Thanks. ----- Message ----- From: Ena Dawley Sent: 05/11/2019   8:42 AM EST To: Ladell Pier, MD Subject: Rheumatology Referral                          Gm  Dr  Carvel Getting card don't cover    On  02/2019 you place the same referral    Dr  Cherlynn Polo the referral   Pt don't have insurance I send her a letter with the options to go to [facility] Rheumatology or [facility] Internal Medicine [address] ph. # [phone] and apply for financial assistant  [phone] Waiting for patient to answer.  I spoke to patient on 02/2019  and she said that she won't  do it right now  .

## 2019-05-17 ENCOUNTER — Telehealth: Payer: Self-pay

## 2019-05-17 NOTE — Telephone Encounter (Signed)
Contacted pt to go over xray results pt is aware and doesn't have any questions or concerns  

## 2019-05-18 LAB — CBC
Hematocrit: 41.3 % (ref 34.0–46.6)
Hemoglobin: 13.9 g/dL (ref 11.1–15.9)
MCH: 30.5 pg (ref 26.6–33.0)
MCHC: 33.7 g/dL (ref 31.5–35.7)
MCV: 91 fL (ref 79–97)
Platelets: 299 10*3/uL (ref 150–450)
RBC: 4.55 x10E6/uL (ref 3.77–5.28)
RDW: 12.6 % (ref 11.7–15.4)
WBC: 5.8 10*3/uL (ref 3.4–10.8)

## 2019-05-18 LAB — HEMOGLOBIN A1C
Est. average glucose Bld gHb Est-mCnc: 123 mg/dL
Hgb A1c MFr Bld: 5.9 % — ABNORMAL HIGH (ref 4.8–5.6)

## 2019-05-18 LAB — COMPREHENSIVE METABOLIC PANEL
ALT: 19 IU/L (ref 0–32)
AST: 19 IU/L (ref 0–40)
Albumin/Globulin Ratio: 1.3 (ref 1.2–2.2)
Albumin: 4.3 g/dL (ref 3.8–4.8)
Alkaline Phosphatase: 112 IU/L (ref 39–117)
BUN/Creatinine Ratio: 19 (ref 12–28)
BUN: 16 mg/dL (ref 8–27)
Bilirubin Total: 0.3 mg/dL (ref 0.0–1.2)
CO2: 22 mmol/L (ref 20–29)
Calcium: 9.3 mg/dL (ref 8.7–10.3)
Chloride: 104 mmol/L (ref 96–106)
Creatinine, Ser: 0.84 mg/dL (ref 0.57–1.00)
GFR calc Af Amer: 86 mL/min/{1.73_m2} (ref >59)
GFR calc non Af Amer: 75 mL/min/{1.73_m2} (ref >59)
Globulin, Total: 3.2 g/dL (ref 1.5–4.5)
Glucose: 109 mg/dL — ABNORMAL HIGH (ref 65–99)
Potassium: 4.2 mmol/L (ref 3.5–5.2)
Sodium: 141 mmol/L (ref 134–144)
Total Protein: 7.5 g/dL (ref 6.0–8.5)

## 2019-05-18 LAB — LIPID PANEL
Chol/HDL Ratio: 3.5 ratio (ref 0.0–4.4)
Cholesterol, Total: 177 mg/dL (ref 100–199)
HDL: 51 mg/dL (ref >39)
LDL Chol Calc (NIH): 106 mg/dL — ABNORMAL HIGH (ref 0–99)
Triglycerides: 111 mg/dL (ref 0–149)
VLDL Cholesterol Cal: 20 mg/dL (ref 5–40)

## 2019-05-18 LAB — RHEUMATOID FACTOR: Rheumatoid fact SerPl-aCnc: 125 IU/mL — ABNORMAL HIGH (ref 0.0–13.9)

## 2019-05-18 LAB — CYCLIC CITRUL PEPTIDE ANTIBODY, IGG/IGA: Cyclic Citrullin Peptide Ab: 6 units (ref 0–19)

## 2019-05-20 ENCOUNTER — Telehealth: Payer: Self-pay

## 2019-05-20 NOTE — Telephone Encounter (Signed)
[place]  Id# [phone]  contacted pt to go over lab results pt is aware and doesn't have any questions or concerns

## 2019-06-13 ENCOUNTER — Other Ambulatory Visit: Payer: Self-pay

## 2019-06-13 ENCOUNTER — Ambulatory Visit: Payer: Self-pay | Admitting: Internal Medicine

## 2019-06-13 ENCOUNTER — Ambulatory Visit: Payer: Self-pay | Attending: Internal Medicine | Admitting: Internal Medicine

## 2019-06-13 ENCOUNTER — Encounter: Payer: Self-pay | Admitting: Internal Medicine

## 2019-06-13 VITALS — BP 119/74 | HR 91 | Temp 97.5°F | Resp 16 | Wt 145.0 lb

## 2019-06-13 DIAGNOSIS — M542 Cervicalgia: Secondary | ICD-10-CM

## 2019-06-13 DIAGNOSIS — R42 Dizziness and giddiness: Secondary | ICD-10-CM

## 2019-06-13 DIAGNOSIS — I10 Essential (primary) hypertension: Secondary | ICD-10-CM

## 2019-06-13 NOTE — Progress Notes (Signed)
Patient ID: Veronica Hart, female    DOB: 16-Feb-1956  MRN: [id number]  CC: Dizziness   Subjective: Veronica Hart is a 64 y.o. female who presents for  1 mth f/u BP and dizziness. Her concerns today include:  Patient with history of?RA, GERD, prediabetes, UACS/allergies, HL, HTN.  Reports dizziness better but still gets dizzy when she bends over or bends neck forward to pick up something off the floor.   Meclizine helps.  She had to use 3 times since last visit She has been keeping log of BP but forgot to bring log. Reports range has been 120s/70-80.  +Rheumatoid factor/pain in jts.  Saw rheumatologist in [facility] today.  Had blood test and x-rays done.  Will call her with results but was told that she does not think pt has RA.      Patient Active Problem List   Diagnosis Date Noted  . Upper airway cough syndrome 04/20/2018  . Lateral epicondylitis of right elbow 03/30/2018  . Right elbow pain 03/23/2018  . Rheumatoid arthritis involving multiple sites with positive rheumatoid factor ([place]) 12/15/2016  . Benign paroxysmal positional vertigo due to bilateral vestibular disorder 09/15/2016  . Elevated rheumatoid factor 05/08/2016  . Arthritis of left hip 05/05/2016  . Environmental allergies 05/05/2016  . Allergic rhinitis 11/21/2013  . Prediabetes 05/17/2013  . Language barrier, speaks Spanish only 02/21/2013  . Arthritis 02/14/2013     Current Outpatient Medications on File Prior to Visit  Medication Sig Dispense Refill  . amLODipine (NORVASC) 5 MG tablet Take 1 tablet (5 mg total) by mouth daily. 30 tablet 2  . diclofenac sodium (VOLTAREN) 1 % GEL Apply 2 g topically 4 (four) times daily. 100 g 2  . fluticasone (FLONASE) 50 MCG/ACT nasal spray Place 1 spray into both nostrils daily. 16 g 5  . meclizine (ANTIVERT) 12.5 MG tablet Take 1 tablet (12.5 mg total) by mouth daily as needed for dizziness. 30 tablet 0  . omeprazole (PRILOSEC) 20 MG capsule TAKE 1  CAPSULE BY MOUTH DAILY. 30 capsule 0   No current facility-administered medications on file prior to visit.    No Known Allergies  Social History   Socioeconomic History  . Marital status: Married    Spouse name: Not on file  . Number of children: Not on file  . Years of education: Not on file  . Highest education level: Not on file  Occupational History  . Not on file  Tobacco Use  . Smoking status: Never Smoker  . Smokeless tobacco: Never Used  Substance and Sexual Activity  . Alcohol use: No    Alcohol/week: 0.0 standard drinks  . Drug use: No  . Sexual activity: Not Currently    Birth control/protection: Surgical  Other Topics Concern  . Not on file  Social History Narrative  . Not on file   Social Determinants of Health   Financial Resource Strain:   . Difficulty of Paying Living Expenses:   Food Insecurity:   . Worried About Charity fundraiser in the Last Year:   . Arboriculturist in the Last Year:   Transportation Needs:   . Film/video editor (Medical):   Marland Kitchen Lack of Transportation (Non-Medical):   Physical Activity:   . Days of Exercise per Week:   . Minutes of Exercise per Session:   Stress:   . Feeling of Stress :   Social Connections:   . Frequency of Communication with Friends and Family:   .  Frequency of Social Gatherings with Friends and Family:   . Attends Religious Services:   . Active Member of Clubs or Organizations:   . Attends Archivist Meetings:   Marland Kitchen Marital Status:   Intimate Partner Violence:   . Fear of Current or Ex-Partner:   . Emotionally Abused:   Marland Kitchen Physically Abused:   . Sexually Abused:     Family History  Problem Relation Age of Onset  . Arthritis Mother   . Colon cancer Neg Hx     Past Surgical History:  Procedure Laterality Date  . TUBAL LIGATION      ROS: Review of Systems Negative except as stated above  PHYSICAL EXAM: BP 119/74   Pulse 91   Temp (!) 97.5 F (36.4 C)   Resp 16   Wt 145 lb  (65.8 kg)   SpO2 97%   BMI 29.29 kg/m   Physical Exam BP sitting 125/76 pulse 86, standing BP 116/70 4P 92 General appearance - alert, well appearing, and in no distress Mental status - normal mood, behavior, speech, dress, motor activity, and thought processes Neck - supple, no significant adenopathy Chest - clear to auscultation, no wheezes, rales or rhonchi, symmetric air entry Heart - normal rate, regular rhythm, normal S1, S2, no murmurs, rubs, clicks or gallops Extremities - peripheral pulses normal, no pedal edema, no clubbing or cyanosis   CMP Latest Ref Rng & Units 05/16/2019 04/20/2018 05/12/2017  Glucose 65 - 99 mg/dL 109(H) 83 99  BUN 8 - 27 mg/dL 16 15 13   Creatinine 0.57 - 1.00 mg/dL 0.84 0.64 0.74  Sodium 134 - 144 mmol/L 141 142 142  Potassium 3.5 - 5.2 mmol/L 4.2 4.3 4.1  Chloride 96 - 106 mmol/L 104 101 105  CO2 20 - 29 mmol/L 22 24 24   Calcium 8.7 - 10.3 mg/dL 9.3 9.4 9.5  Total Protein 6.0 - 8.5 g/dL 7.5 7.3 7.5  Total Bilirubin 0.0 - 1.2 mg/dL 0.3 0.2 0.3  Alkaline Phos 39 - 117 IU/L 112 103 100  AST 0 - 40 IU/L 19 29 27   ALT 0 - 32 IU/L 19 37(H) 39(H)   Lipid Panel     Component Value Date/Time   CHOL 177 05/16/2019 1013   TRIG 111 05/16/2019 1013   HDL 51 05/16/2019 1013   CHOLHDL 3.5 05/16/2019 1013   CHOLHDL 3.5 11/21/2013 1749   VLDL 24 11/21/2013 1749   LDLCALC 106 (H) 05/16/2019 1013    CBC    Component Value Date/Time   WBC 5.8 05/16/2019 1013   WBC 5.7 05/05/2016 1007   RBC 4.55 05/16/2019 1013   RBC 4.56 05/05/2016 1007   HGB 13.9 05/16/2019 1013   HCT 41.3 05/16/2019 1013   PLT 299 05/16/2019 1013   MCV 91 05/16/2019 1013   MCH 30.5 05/16/2019 1013   MCH 29.8 05/05/2016 1007   MCHC 33.7 05/16/2019 1013   MCHC 33.3 05/05/2016 1007   RDW 12.6 05/16/2019 1013   LYMPHSABS 2.1 05/12/2017 1100   MONOABS 0.4 01/22/2010 2119   EOSABS 0.8 (H) 05/12/2017 1100   BASOSABS 0.1 05/12/2017 1100    ASSESSMENT AND PLAN: 1. Essential  hypertension At goal.  Continue amlodipine  2. Dizziness Advised patient to go slow with position changes. If dizziness persists with bending of the neck, we can refer to neurology  3. Neck pain X-ray of cervical spine was normal.  Recommend using Voltaren gel on the posterior neck to see if it will help.  Patient was given the opportunity to ask questions.  Patient verbalized understanding of the plan and was able to repeat key elements of the plan.  [employer] interpreter used during this encounter. [id number]  No orders of the defined types were placed in this encounter.    Requested Prescriptions    No prescriptions requested or ordered in this encounter    No follow-ups on file.  Karle Plumber, MD, FACP

## 2019-06-14 ENCOUNTER — Ambulatory Visit
Admission: RE | Admit: 2019-06-14 | Discharge: 2019-06-14 | Disposition: A | Discharge status: Home / Self Care | Payer: No Typology Code available for payment source | Source: Ambulatory Visit | Attending: Internal Medicine | Admitting: Internal Medicine

## 2019-06-14 DIAGNOSIS — Z1231 Encounter for screening mammogram for malignant neoplasm of breast: Secondary | ICD-10-CM

## 2019-06-14 MED FILL — HYDROXYCHLOROQUINE SULFATE: 200 | 30 days supply | Qty: 30 | Fill #0

## 2019-06-17 ENCOUNTER — Telehealth: Payer: Self-pay | Admitting: Internal Medicine

## 2019-06-17 ENCOUNTER — Telehealth: Payer: Self-pay

## 2019-06-17 NOTE — Telephone Encounter (Signed)
Patient called into the office for results. Spanish speaking [place] interpreter was called, Beverely Low [phone]. Patient was identified by 2 identifiers. Patient verbalized understanding and had no further questions or concerns at this time.

## 2019-06-17 NOTE — Telephone Encounter (Signed)
Contacted pt to go over MM results pt didn't answer and was unable to lvm

## 2019-06-23 ENCOUNTER — Other Ambulatory Visit: Payer: Self-pay | Admitting: Internal Medicine

## 2019-06-23 MED FILL — AMLODIPINE BESYLATE 5 MG TA: 5 | 30 days supply | Qty: 30 | Fill #0

## 2019-07-10 ENCOUNTER — Ambulatory Visit (INDEPENDENT_AMBULATORY_CARE_PROVIDER_SITE_OTHER): Payer: Worker's Compensation

## 2019-07-10 ENCOUNTER — Ambulatory Visit (HOSPITAL_COMMUNITY)
Admission: EM | Admit: 2019-07-10 | Discharge: 2019-07-10 | Disposition: A | Discharge status: Home / Self Care | Payer: Worker's Compensation | Attending: Emergency Medicine | Admitting: Emergency Medicine

## 2019-07-10 ENCOUNTER — Other Ambulatory Visit: Payer: Self-pay

## 2019-07-10 ENCOUNTER — Encounter (HOSPITAL_COMMUNITY): Payer: Self-pay

## 2019-07-10 DIAGNOSIS — S52502A Unspecified fracture of the lower end of left radius, initial encounter for closed fracture: Secondary | ICD-10-CM

## 2019-07-10 MED ORDER — IBUPROFEN 800 MG PO TABS: 800.0000 mg | ORAL_TABLET | Freq: Three times a day (TID) | ORAL | 0 refills | Status: DC

## 2019-07-10 MED ORDER — HYDROCODONE-ACETAMINOPHEN 5-325 MG PO TABS: 1.0000 | ORAL_TABLET | Freq: Four times a day (QID) | ORAL | 0 refills | Status: DC | PRN

## 2019-07-10 NOTE — ED Provider Notes (Signed)
[facility]    CSN: [id number] Arrival date & time: 07/10/19  1012      History   Chief Complaint Chief Complaint  Patient presents with  . Wrist Injury  . Hand Injury    HPI Veronica Hart is a 64 y.o. female presenting today for evaluation of wrist and hand injury.  Patient was at work yesterday slipped and fell caught herself on her left hand and wrist.  Has had pain swelling and bruising in her wrist extending into her hand.  Pain with movement.  Denies prior fractures in hand/wrist.  HPI  Past Medical History:  Diagnosis Date  . Allergy    seasonal  . Arthritis     Patient Active Problem List   Diagnosis Date Noted  . Upper airway cough syndrome 04/20/2018  . Lateral epicondylitis of right elbow 03/30/2018  . Right elbow pain 03/23/2018  . Rheumatoid arthritis involving multiple sites with positive rheumatoid factor ([place]) 12/15/2016  . Benign paroxysmal positional vertigo due to bilateral vestibular disorder 09/15/2016  . Elevated rheumatoid factor 05/08/2016  . Arthritis of left hip 05/05/2016  . Environmental allergies 05/05/2016  . Allergic rhinitis 11/21/2013  . Prediabetes 05/17/2013  . Language barrier, speaks Spanish only 02/21/2013  . Arthritis 02/14/2013    Past Surgical History:  Procedure Laterality Date  . TUBAL LIGATION      OB History    Gravida  2   Para  2   Term  2   Preterm      AB      Living  2     SAB      TAB      Ectopic      Multiple      Live Births               Home Medications    Prior to Admission medications   Medication Sig Start Date End Date Taking? Authorizing Provider  amLODipine (NORVASC) 5 MG tablet TAKE 1 TABLET (5 MG TOTAL) BY MOUTH DAILY. 06/23/19   Ladell Pier, MD  diclofenac sodium (VOLTAREN) 1 % GEL Apply 2 g topically 4 (four) times daily. 04/20/18   Ladell Pier, MD  fluticasone (FLONASE) 50 MCG/ACT nasal spray Place 1 spray into both nostrils daily.  04/20/18   Ladell Pier, MD  HYDROcodone-acetaminophen (NORCO/VICODIN) 5-325 MG tablet Take 1-2 tablets by mouth every 6 (six) hours as needed for severe pain. 07/10/19   Jenita Rayfield C, PA-C  ibuprofen (ADVIL) 800 MG tablet Take 1 tablet (800 mg total) by mouth 3 (three) times daily. 07/10/19   Soham Hollett C, PA-C  meclizine (ANTIVERT) 12.5 MG tablet Take 1 tablet (12.5 mg total) by mouth daily as needed for dizziness. 05/10/19   Ladell Pier, MD  omeprazole (PRILOSEC) 20 MG capsule TAKE 1 CAPSULE BY MOUTH DAILY. 12/28/18   Ladell Pier, MD    Family History Family History  Problem Relation Age of Onset  . Arthritis Mother   . Colon cancer Neg Hx     Social History Social History   Tobacco Use  . Smoking status: Never Smoker  . Smokeless tobacco: Never Used  Substance Use Topics  . Alcohol use: No    Alcohol/week: 0.0 standard drinks  . Drug use: No     Allergies   Patient has no known allergies.   Review of Systems Review of Systems  Constitutional: Negative for fatigue and fever.  Eyes: Negative for visual disturbance.  Respiratory: Negative for shortness of breath.   Cardiovascular: Negative for chest pain.  Gastrointestinal: Negative for abdominal pain, nausea and vomiting.  Musculoskeletal: Positive for arthralgias and joint swelling.  Skin: Positive for color change. Negative for rash and wound.  Neurological: Negative for dizziness, weakness, light-headedness and headaches.     Physical Exam Triage Vital Signs ED Triage Vitals  Enc Vitals Group     BP 07/10/19 1054 (!) 153/96     Pulse Rate 07/10/19 1054 68     Resp 07/10/19 1054 16     Temp 07/10/19 1054 97.8 F (36.6 C)     Temp Source 07/10/19 1054 Oral     SpO2 07/10/19 1054 98 %     Weight --      Height --      Head Circumference --      Peak Flow --      Pain Score 07/10/19 1055 8     Pain Loc --      Pain Edu? --      Excl. in [place]? --    No data found.  Updated Vital  Signs BP (!) 153/96 (BP Location: Right Arm)   Pulse 68   Temp 97.8 F (36.6 C) (Oral)   Resp 16   SpO2 98%   Visual Acuity Right Eye Distance:   Left Eye Distance:   Bilateral Distance:    Right Eye Near:   Left Eye Near:    Bilateral Near:     Physical Exam Vitals and nursing note reviewed.  Constitutional:      Appearance: She is well-developed.     Comments: No acute distress  HENT:     Head: Normocephalic and atraumatic.     Nose: Nose normal.  Eyes:     Conjunctiva/sclera: Conjunctivae normal.  Cardiovascular:     Rate and Rhythm: Normal rate.  Pulmonary:     Effort: Pulmonary effort is normal. No respiratory distress.  Abdominal:     General: There is no distension.  Musculoskeletal:        General: Normal range of motion.     Cervical back: Neck supple.     Comments: Left breast/hand: Moderate swelling noted to wrist extending into dorsum of hand, overlying bruising noted as well over these areas, tender to palpation of distal radius and ulna extending into carpal area on along second carpal Radial pulse 2+  Skin:    General: Skin is warm and dry.  Neurological:     Mental Status: She is alert and oriented to person, place, and time.      UC Treatments / Results  Labs (all labs ordered are listed, but only abnormal results are displayed) Labs Reviewed - No data to display  EKG   Radiology DG Wrist Complete Left  Result Date: 07/10/2019 CLINICAL DATA:  Fall yesterday with left wrist pain and swelling EXAM: LEFT WRIST - COMPLETE 3+ VIEW COMPARISON:  None. FINDINGS: Comminuted and probably intra-articular left distal radius fracture with mild impaction and no significant displacement, with surrounding left wrist soft tissue swelling. No additional fractures. No dislocation. No focal osseous lesions. No significant arthropathy. No radiopaque foreign bodies. IMPRESSION: Comminuted and probably intra-articular left distal radius fracture with mild impaction  and no significant displacement. Electronically Signed   By: Ilona Sorrel M.D.   On: 07/10/2019 11:40   DG Hand Complete Left  Result Date: 07/10/2019 CLINICAL DATA:  Fall yesterday with distal left second metacarpal and left wrist pain EXAM: LEFT  HAND - COMPLETE 3+ VIEW COMPARISON:  05/25/2017 left hand radiographs FINDINGS: Comminuted impacted distal left radius fracture with diffuse left wrist soft tissue swelling. No fracture or dislocation in the left hand. No focal osseous lesions. No significant arthropathy. No radiopaque foreign bodies. IMPRESSION: Comminuted impacted distal left radius fracture with diffuse left wrist soft tissue swelling. No fracture or dislocation in the left hand. Electronically Signed   By: Ilona Sorrel M.D.   On: 07/10/2019 11:44    Procedures Procedures (including critical care time)  Medications Ordered in UC Medications - No data to display  Initial Impression / Assessment and Plan / UC Course  I have reviewed the triage vital signs and the nursing notes.  Pertinent labs & imaging results that were available during my care of the patient were reviewed by me and considered in my medical decision making (see chart for details).    Comminuted fracture of distal radius, placing an sugar tong splint and will have follow-up with orthopedics.  Dr. Mardelle Matte on-call, providing Raliegh Ip follow-up contact.  Provided ibuprofen and hydrocodone for pain.  Provided work note that she may return pending clearance from orthopedics.  Discussed strict return precautions. Patient verbalized understanding and is agreeable with plan.   Final Clinical Impressions(s) / UC Diagnoses   Final diagnoses:  Closed fracture of distal end of left radius, unspecified fracture morphology, initial encounter     Discharge Instructions     Fracture to wrist- radius Follow up with orthopedics for further management of fracture Use anti-inflammatories for pain/swelling. You may take  up to 800 mg Ibuprofen every 8 hours with food. You may supplement Ibuprofen with Tylenol [phone] mg every 8 hours.  Hydrocodone for severe pain, use sparingly, will cause drowsiness, do not drive/work after taking    ED Prescriptions    Medication Sig Dispense Auth. Provider   ibuprofen (ADVIL) 800 MG tablet Take 1 tablet (800 mg total) by mouth 3 (three) times daily. 21 tablet Brinley Rosete C, PA-C   HYDROcodone-acetaminophen (NORCO/VICODIN) 5-325 MG tablet Take 1-2 tablets by mouth every 6 (six) hours as needed for severe pain. 10 tablet Vaniah Chambers, [place] C, PA-C     I have reviewed the PDMP during this encounter.   Joneen Caraway [place] C, PA-C 07/10/19 1155

## 2019-07-10 NOTE — ED Triage Notes (Signed)
Pt present a fall at work and injured her left wrist/hand. Pt hand and wrist are red with swelling

## 2019-07-10 NOTE — Discharge Instructions (Addendum)
Fracture to wrist- radius Follow up with orthopedics for further management of fracture Use anti-inflammatories for pain/swelling. You may take up to 800 mg Ibuprofen every 8 hours with food. You may supplement Ibuprofen with Tylenol [phone] mg every 8 hours.  Hydrocodone for severe pain, use sparingly, will cause drowsiness, do not drive/work after taking

## 2019-07-10 NOTE — Progress Notes (Signed)
Orthopedic Tech Progress Note Patient Details:  Veronica Hart Apr 03, 1955 [id number]  Ortho Devices Type of Ortho Device: Arm sling, Sugartong splint Ortho Device/Splint Location: LUE Ortho Device/Splint Interventions: Ordered, Application   Post Interventions Patient Tolerated: Well Instructions Provided: Care of [place] 07/10/2019, 12:09 PM

## 2019-10-17 ENCOUNTER — Other Ambulatory Visit: Payer: Self-pay

## 2019-10-17 ENCOUNTER — Ambulatory Visit: Payer: Self-pay | Attending: Internal Medicine | Admitting: Internal Medicine

## 2019-10-17 DIAGNOSIS — M0579 Rheumatoid arthritis with rheumatoid factor of multiple sites without organ or systems involvement: Secondary | ICD-10-CM

## 2019-10-17 DIAGNOSIS — I10 Essential (primary) hypertension: Secondary | ICD-10-CM

## 2019-10-17 NOTE — Progress Notes (Signed)
Virtual Visit via Telephone Note Due to current restrictions/limitations of in-office visits due to the COVID-19 pandemic, this scheduled clinical appointment was converted to a telehealth visit  I connected with Veronica Hart on 10/17/19 at 9:26 a.m by telephone and verified that I am speaking with the correct person using two identifiers. I am in my office.  The patient is at home.  Only the patient, myself and [name] from [employer] [phone])  participated in this encounter.  I discussed the limitations, risks, security and privacy concerns of performing an evaluation and management service by telephone and the availability of in person appointments. I also discussed with the patient that there may be a patient responsible charge related to this service. The patient expressed understanding and agreed to proceed.   History of Present Illness: Patient with history of?RA, GERD, prediabetes, UACS/allergies, HL, HTN.  Still having pain in my hands.   [place] and fractured her LT hand at work May 1st requiring placement of hardware.  Doing P.T.  Ortho prescribed some Prednisone for her and wants to know if I can prescribe more for her.  Rheumatology:  No show appt for f/u with the rheumatologist at [facility] due to cost.  She had applied for help but was told they only offer emergency help.  She tells me that she was told that lab results confirm that she has RA.  She was started on  Hydroxychlorquine but was stopped due to S.E of diarrhea, blurred vision and weakness.  HTN:  Reports compliance with med - Norvasc.  Range: 119-125/70-80 No CP, SOB, LE edema, HA  Outpatient Encounter Medications as of 10/17/2019  Medication Sig  . amLODipine (NORVASC) 5 MG tablet TAKE 1 TABLET (5 MG TOTAL) BY MOUTH DAILY.  Marland Kitchen diclofenac sodium (VOLTAREN) 1 % GEL Apply 2 g topically 4 (four) times daily.  . fluticasone (FLONASE) 50 MCG/ACT nasal spray Place 1 spray into both nostrils daily.  Marland Kitchen  HYDROcodone-acetaminophen (NORCO/VICODIN) 5-325 MG tablet Take 1-2 tablets by mouth every 6 (six) hours as needed for severe pain.  Marland Kitchen ibuprofen (ADVIL) 800 MG tablet Take 1 tablet (800 mg total) by mouth 3 (three) times daily.  . meclizine (ANTIVERT) 12.5 MG tablet Take 1 tablet (12.5 mg total) by mouth daily as needed for dizziness.  Marland Kitchen omeprazole (PRILOSEC) 20 MG capsule TAKE 1 CAPSULE BY MOUTH DAILY.   No facility-administered encounter medications on file as of 10/17/2019.      Observations/Objective: Results for orders placed or performed in visit on 45/80/99  CYCLIC CITRUL PEPTIDE ANTIBODY, IGG/IGA  Result Value Ref Range   Cyclic Citrullin Peptide Ab 6 0 - 19 units  Rheumatoid factor  Result Value Ref Range   Rhuematoid fact SerPl-aCnc 125.0 (H) 0.0 - 13.9 IU/mL  Hemoglobin A1c  Result Value Ref Range   Hgb A1c MFr Bld 5.9 (H) 4.8 - 5.6 %   Est. average glucose Bld gHb Est-mCnc 123 mg/dL  Lipid panel  Result Value Ref Range   Cholesterol, Total 177 100 - 199 mg/dL   Triglycerides 111 0 - 149 mg/dL   HDL 51 >39 mg/dL   VLDL Cholesterol Cal 20 5 - 40 mg/dL   LDL Chol Calc (NIH) 106 (H) 0 - 99 mg/dL   Chol/HDL Ratio 3.5 0.0 - 4.4 ratio  Comprehensive metabolic panel  Result Value Ref Range   Glucose 109 (H) 65 - 99 mg/dL   BUN 16 8 - 27 mg/dL   Creatinine, Ser 0.84 0.57 - 1.00  mg/dL   GFR calc non Af Amer 75 >59 mL/min/1.73   GFR calc Af Amer 86 >59 mL/min/1.73   BUN/Creatinine Ratio 19 12 - 28   Sodium 141 134 - 144 mmol/L   Potassium 4.2 3.5 - 5.2 mmol/L   Chloride 104 96 - 106 mmol/L   CO2 22 20 - 29 mmol/L   Calcium 9.3 8.7 - 10.3 mg/dL   Total Protein 7.5 6.0 - 8.5 g/dL   Albumin 4.3 3.8 - 4.8 g/dL   Globulin, Total 3.2 1.5 - 4.5 g/dL   Albumin/Globulin Ratio 1.3 1.2 - 2.2   Bilirubin Total 0.3 0.0 - 1.2 mg/dL   Alkaline Phosphatase 112 39 - 117 IU/L   AST 19 0 - 40 IU/L   ALT 19 0 - 32 IU/L  CBC  Result Value Ref Range   WBC 5.8 3.4 - 10.8 x10E3/uL   RBC  4.55 3.77 - 5.28 x10E6/uL   Hemoglobin 13.9 11.1 - 15.9 g/dL   Hematocrit 41.3 34.0 - 46.6 %   MCV 91 79 - 97 fL   MCH 30.5 26.6 - 33.0 pg   MCHC 33.7 31 - 35 g/dL   RDW 12.6 11.7 - 15.4 %   Platelets 299 150 - 450 x10E3/uL     Assessment and Plan: 1. Essential hypertension At goal.  Continue current medications and low-salt diet.  2. Rheumatoid arthritis involving multiple sites with positive rheumatoid factor ([place]) Advised patient to try to reschedule the appointment with the rheumatologist as RA leads to progressive destruction of the joints and can also affect other organ systems.  She states that she has just returned to work and that she will save her funds and then reschedule the appointment   Follow Up Instructions: 3 mths in person   I discussed the assessment and treatment plan with the patient. The patient was provided an opportunity to ask questions and all were answered. The patient agreed with the plan and demonstrated an understanding of the instructions.   The patient was advised to call back or seek an in-person evaluation if the symptoms worsen or if the condition fails to improve as anticipated.  I provided 16 minutes of non-face-to-face time during this encounter.   Karle Plumber, MD

## 2019-11-02 ENCOUNTER — Other Ambulatory Visit: Payer: Self-pay

## 2019-11-02 ENCOUNTER — Ambulatory Visit: Payer: Self-pay

## 2020-01-04 ENCOUNTER — Ambulatory Visit: Payer: Self-pay | Attending: Internal Medicine

## 2020-01-04 ENCOUNTER — Other Ambulatory Visit: Payer: Self-pay

## 2020-01-04 VITALS — Temp 97.3°F

## 2020-01-04 DIAGNOSIS — Z23 Encounter for immunization: Secondary | ICD-10-CM

## 2020-01-04 NOTE — Progress Notes (Signed)
Pt seen in clinic for flu vaccine administration, given in LD, pt tolerated well w/o adverse reaction noted

## 2020-01-04 NOTE — Addendum Note (Signed)
Addended by: Eddie Dibbles on: 01/04/2020 10:42 AM   Modules accepted: Level of Service

## 2020-02-16 ENCOUNTER — Ambulatory Visit: Payer: No Typology Code available for payment source | Admitting: Internal Medicine

## 2020-02-21 ENCOUNTER — Ambulatory Visit: Payer: Self-pay | Attending: Internal Medicine | Admitting: Internal Medicine

## 2020-02-21 ENCOUNTER — Encounter: Payer: Self-pay | Admitting: Internal Medicine

## 2020-02-21 ENCOUNTER — Other Ambulatory Visit: Payer: Self-pay | Admitting: Internal Medicine

## 2020-02-21 ENCOUNTER — Other Ambulatory Visit: Payer: Self-pay

## 2020-02-21 VITALS — BP 130/90 | HR 81 | Temp 98.5°F | Resp 16 | Wt 138.6 lb

## 2020-02-21 DIAGNOSIS — R7303 Prediabetes: Secondary | ICD-10-CM

## 2020-02-21 DIAGNOSIS — I10 Essential (primary) hypertension: Secondary | ICD-10-CM

## 2020-02-21 DIAGNOSIS — E663 Overweight: Secondary | ICD-10-CM

## 2020-02-21 DIAGNOSIS — J302 Other seasonal allergic rhinitis: Secondary | ICD-10-CM

## 2020-02-21 DIAGNOSIS — M25561 Pain in right knee: Secondary | ICD-10-CM

## 2020-02-21 DIAGNOSIS — M545 Low back pain, unspecified: Secondary | ICD-10-CM

## 2020-02-21 MED ORDER — FLUTICASONE PROPIONATE 50 MCG/ACT NA SUSP: 1.0000 | Freq: Every day | NASAL | 5 refills | Status: DC

## 2020-02-21 MED ORDER — DICLOFENAC SODIUM 1 % EX GEL: 2.0000 g | Freq: Four times a day (QID) | CUTANEOUS | 1 refills | Status: DC

## 2020-02-21 MED FILL — FLUTICASONE PROP 50 MCG SPR: 50 | 30 days supply | Qty: 16 | Fill #0

## 2020-02-21 MED FILL — DICLOFENAC SODIUM 1% GEL: 1 | 12 days supply | Qty: 100 | Fill #0

## 2020-02-21 NOTE — Progress Notes (Signed)
Patient ID: Meryle Pugmire, female    DOB: 12-12-1955  MRN: [id number]  CC: Hypertension and Hip Pain (/)   Subjective: Sandrea Boer is a 64 y.o. female who presents for chronic ds management Her concerns today include:  Patient with history of?RA, GERD, prediabetes, UACS/allergies, HL, HTN.  C/o pain medial aspect RT knee and RT lower back (she points to the cheek of buttock) x 3 wks.  No initiating factors. Pain in buttock does not radiate, no numbness or tingling. Worse in the afternoon and with walking No swelling in the knee.  Pain constant in knee. Taking Tylenol PRN like 2 BID.   It does help. -saw Rheumatologist earlier this yr.  Initially told she did not have RA but still tried with hydroxychloroquine which she did not tolerate.  Since then she states she has been pain-free in her hands.  X-rays of the hands done at [facility] showed no signs of RA.    HTN: checks BP 2x/wk.  Runs 125-130/70s Did not take med as yet today.  Forgot to take it this a.m.  PreDM/Overwgh:  Loss 7 lbs months last visit with me in April. Doing okay with eating habits.  Very active on her job.  She works 3 days a week but in total puts in about 40 hours.  Planes of itching in throat and ears.  Occurs mainly at night.  No drainage.  No itchy watery eyes.  No sneezing.  Has history of allergic rhinitis Takes generic Claritin every day. Out of Flonase.    Patient Active Problem List   Diagnosis Date Noted  . Upper airway cough syndrome 04/20/2018  . Lateral epicondylitis of right elbow 03/30/2018  . Right elbow pain 03/23/2018  . Rheumatoid arthritis involving multiple sites with positive rheumatoid factor ([place]) 12/15/2016  . Benign paroxysmal positional vertigo due to bilateral vestibular disorder 09/15/2016  . Elevated rheumatoid factor 05/08/2016  . Arthritis of left hip 05/05/2016  . Environmental allergies 05/05/2016  . Allergic rhinitis 11/21/2013  . Prediabetes  05/17/2013  . Language barrier, speaks Spanish only 02/21/2013  . Arthritis 02/14/2013     Current Outpatient Medications on File Prior to Visit  Medication Sig Dispense Refill  . amLODipine (NORVASC) 5 MG tablet TAKE 1 TABLET (5 MG TOTAL) BY MOUTH DAILY. 30 tablet 2  . HYDROcodone-acetaminophen (NORCO/VICODIN) 5-325 MG tablet Take 1-2 tablets by mouth every 6 (six) hours as needed for severe pain. 10 tablet 0  . ibuprofen (ADVIL) 800 MG tablet Take 1 tablet (800 mg total) by mouth 3 (three) times daily. 21 tablet 0  . meclizine (ANTIVERT) 12.5 MG tablet Take 1 tablet (12.5 mg total) by mouth daily as needed for dizziness. 30 tablet 0  . omeprazole (PRILOSEC) 20 MG capsule TAKE 1 CAPSULE BY MOUTH DAILY. 30 capsule 0   No current facility-administered medications on file prior to visit.    Allergies  Allergen Reactions  . Hydroxychloroquine     Causes diarrhea, blurred vision and weakness.    Social History   Socioeconomic History  . Marital status: Married    Spouse name: Not on file  . Number of children: Not on file  . Years of education: Not on file  . Highest education level: Not on file  Occupational History  . Not on file  Tobacco Use  . Smoking status: Never Smoker  . Smokeless tobacco: Never Used  Substance and Sexual Activity  . Alcohol use: No    Alcohol/week: 0.0  standard drinks  . Drug use: No  . Sexual activity: Not Currently    Birth control/protection: Surgical  Other Topics Concern  . Not on file  Social History Narrative  . Not on file   Social Determinants of Health   Financial Resource Strain: Not on file  Food Insecurity: Not on file  Transportation Needs: Not on file  Physical Activity: Not on file  Stress: Not on file  Social Connections: Not on file  Intimate Partner Violence: Not on file    Family History  Problem Relation Age of Onset  . Arthritis Mother   . Colon cancer Neg Hx     Past Surgical History:  Procedure Laterality  Date  . TUBAL LIGATION      ROS: Review of Systems Negative except as stated above  PHYSICAL EXAM: BP 130/90   Pulse 81   Temp 98.5 F (36.9 C)   Resp 16   Wt 138 lb 9.6 oz (62.9 kg)   SpO2 96%   BMI 27.99 kg/m   Wt Readings from Last 3 Encounters:  02/21/20 138 lb 9.6 oz (62.9 kg)  06/13/19 145 lb (65.8 kg)  02/07/19 145 lb 12.8 oz (66.1 kg)    Physical Exam  General appearance - alert, well appearing, and in no distress Mental status - normal mood, behavior, speech, dress, motor activity, and thought processes Ears - bilateral TM's and external ear canals normal Nose - normal and patent, no erythema, discharge or polyps Mouth - mucous membranes moist, pharynx normal without lesions Neck - supple, no significant adenopathy Chest - clear to auscultation, no wheezes, rales or rhonchi, symmetric air entry Heart - normal rate, regular rhythm, normal S1, S2, no murmurs, rubs, clicks or gallops Musculoskeletal -right knee: No edema or erythema.  Mild tenderness below the medial joint line.  Good passive and active range of motion.   No tenderness on palpation of the lumbar spine Extremities -no lower extremity edema   CMP Latest Ref Rng & Units 05/16/2019 04/20/2018 05/12/2017  Glucose 65 - 99 mg/dL 109(H) 83 99  BUN 8 - 27 mg/dL 16 15 13   Creatinine 0.57 - 1.00 mg/dL 0.84 0.64 0.74  Sodium 134 - 144 mmol/L 141 142 142  Potassium 3.5 - 5.2 mmol/L 4.2 4.3 4.1  Chloride 96 - 106 mmol/L 104 101 105  CO2 20 - 29 mmol/L 22 24 24   Calcium 8.7 - 10.3 mg/dL 9.3 9.4 9.5  Total Protein 6.0 - 8.5 g/dL 7.5 7.3 7.5  Total Bilirubin 0.0 - 1.2 mg/dL 0.3 0.2 0.3  Alkaline Phos 39 - 117 IU/L 112 103 100  AST 0 - 40 IU/L 19 29 27   ALT 0 - 32 IU/L 19 37(H) 39(H)   Lipid Panel     Component Value Date/Time   CHOL 177 05/16/2019 1013   TRIG 111 05/16/2019 1013   HDL 51 05/16/2019 1013   CHOLHDL 3.5 05/16/2019 1013   CHOLHDL 3.5 11/21/2013 1749   VLDL 24 11/21/2013 1749   LDLCALC 106  (H) 05/16/2019 1013    CBC    Component Value Date/Time   WBC 5.8 05/16/2019 1013   WBC 5.7 05/05/2016 1007   RBC 4.55 05/16/2019 1013   RBC 4.56 05/05/2016 1007   HGB 13.9 05/16/2019 1013   HCT 41.3 05/16/2019 1013   PLT 299 05/16/2019 1013   MCV 91 05/16/2019 1013   MCH 30.5 05/16/2019 1013   MCH 29.8 05/05/2016 1007   MCHC 33.7 05/16/2019 1013  MCHC 33.3 05/05/2016 1007   RDW 12.6 05/16/2019 1013   LYMPHSABS 2.1 05/12/2017 1100   MONOABS 0.4 01/22/2010 2119   EOSABS 0.8 (H) 05/12/2017 1100   BASOSABS 0.1 05/12/2017 1100    ASSESSMENT AND PLAN: 1. Essential hypertension Not at goal.  Patient has not taken her antihypertensive as yet for today.  She will take as soon as she returns home.  2. Acute pain of right knee Questionable etiology.  She will continue Tylenol.  I have added some Voltaren gel.  If no improvement she will let me know so that we can image and refer to orthopedics - diclofenac Sodium (VOLTAREN) 1 % GEL; Apply 2 g topically 4 (four) times daily.  Dispense: 100 g; Refill: 1  3. Acute right-sided low back pain without sciatica - diclofenac Sodium (VOLTAREN) 1 % GEL; Apply 2 g topically 4 (four) times daily.  Dispense: 100 g; Refill: 1  4. Seasonal allergic rhinitis, unspecified trigger - fluticasone (FLONASE) 50 MCG/ACT nasal spray; Place 1 spray into both nostrils daily.  Dispense: 16 g; Refill: 5  5. Prediabetes 6. Overweight (BMI 25.0-29.9) Encourage healthy eating habits.  Commended her on weight loss.  Continue to stay active   Patient was given the opportunity to ask questions.  Patient verbalized understanding of the plan and was able to repeat key elements of the plan.  [employer] video interpreter used during this encounter. #[id number]  No orders of the defined types were placed in this encounter.    Requested Prescriptions   Signed Prescriptions Disp Refills  . fluticasone (FLONASE) 50 MCG/ACT nasal spray 16 g 5    Sig: Place 1 spray into both  nostrils daily.  . diclofenac Sodium (VOLTAREN) 1 % GEL 100 g 1    Sig: Apply 2 g topically 4 (four) times daily.    Return in about 4 months (around 06/21/2020).  Karle Plumber, MD, FACP

## 2020-05-29 ENCOUNTER — Other Ambulatory Visit: Payer: Self-pay

## 2020-05-29 ENCOUNTER — Ambulatory Visit: Payer: Self-pay | Attending: Internal Medicine

## 2020-06-19 ENCOUNTER — Other Ambulatory Visit: Payer: Self-pay

## 2020-06-19 ENCOUNTER — Ambulatory Visit: Payer: Self-pay | Attending: Internal Medicine | Admitting: Internal Medicine

## 2020-06-19 DIAGNOSIS — B9789 Other viral agents as the cause of diseases classified elsewhere: Secondary | ICD-10-CM

## 2020-06-19 DIAGNOSIS — J988 Other specified respiratory disorders: Secondary | ICD-10-CM

## 2020-06-19 DIAGNOSIS — K219 Gastro-esophageal reflux disease without esophagitis: Secondary | ICD-10-CM

## 2020-06-19 DIAGNOSIS — I10 Essential (primary) hypertension: Secondary | ICD-10-CM

## 2020-06-19 DIAGNOSIS — J029 Acute pharyngitis, unspecified: Secondary | ICD-10-CM

## 2020-06-19 DIAGNOSIS — R1011 Right upper quadrant pain: Secondary | ICD-10-CM

## 2020-06-19 MED ORDER — AMOXICILLIN 500 MG PO CAPS
500.0000 mg | ORAL_CAPSULE | Freq: Three times a day (TID) | ORAL | 0 refills | Status: DC
  Filled 2020-06-19: qty 21, 7d supply, fill #0

## 2020-06-19 MED ORDER — BENZONATATE 100 MG PO CAPS
100.0000 mg | ORAL_CAPSULE | Freq: Two times a day (BID) | ORAL | 0 refills | Status: DC | PRN
  Filled 2020-06-19: qty 20, 10d supply, fill #0

## 2020-06-19 MED ORDER — OMEPRAZOLE 20 MG PO CPDR
1.0000 | DELAYED_RELEASE_CAPSULE | Freq: Every day | ORAL | 6 refills | Status: DC
  Filled 2020-06-19 (×2): qty 30, 30d supply, fill #0

## 2020-06-19 NOTE — Progress Notes (Signed)
Virtual Visit via Telephone Note  I connected with Veronica Hart on 06/19/2020 at 10:03 a.m by telephone and verified that I am speaking with the correct person using two identifiers  Location: Patient: home Provider: office  Participants: Myself Patient Wise interpreter: [phone], Anderson Malta   I discussed the limitations, risks, security and privacy concerns of performing an evaluation and management service by telephone and the availability of in person appointments. I also discussed with the patient that there may be a patient responsible charge related to this service. The patient expressed understanding and agreed to proceed.   History of Present Illness: Patient with history of?RA (saw a rheumatologist at [facility] 2021 and told she does not have RA), GERD, prediabetes, UACS/allergies, HL, HTN.  Today's visit is for chronic ds management  Pt reports she is not doing well today C/o having a cold x 2 wks Symptoms include sore throat with burning, some congestion, productive cough that is less today.  Some loss of smell.  Sore throat has persisted and has gotten worse over the past few days.  Grandkids had similar symptoms 2 wks ago.  They tested negative for COVID. Pt has not had COVID test done.  Pt has had 3 shots of COVID vaccines.  Feeling better compared to when symptoms started 2 wks ago. No SOB, fever, no sneezing.  Some itchy watery eyes last wk.  Given Benadryl.  Taking Claritin.  She has Flonase but has not been using it.  Also c/o mid RT sided back pain below scapular x 3 wks.  Constant, but fluctuates in intensity Radiates to RT upper abdomen. Abdominal pain comes and goes.  "Feels like gas stuck inside."   No epigastric pain No N/V.  Pain sometimes worse with foods. Like coffee and spicy foods.  She has omeprazole on her med list but has been out of it for quite some time. Pain woke her from sleep twice and she had to take Tylenol to get back to sleep  HTN:   Compliant with amlodipine.  Checks blood pressure intermittently.  Range has been 125-30/75-80 Outpatient Encounter Medications as of 06/19/2020  Medication Sig  . amLODipine (NORVASC) 5 MG tablet TAKE 1 TABLET (5 MG TOTAL) BY MOUTH DAILY.  Marland Kitchen diclofenac Sodium (VOLTAREN) 1 % GEL APPLY 2 G TOPICALLY 4 (FOUR) TIMES DAILY.  . fluticasone (FLONASE) 50 MCG/ACT nasal spray PLACE 1 SPRAY INTO BOTH NOSTRILS DAILY.  Marland Kitchen HYDROcodone-acetaminophen (NORCO/VICODIN) 5-325 MG tablet Take 1-2 tablets by mouth every 6 (six) hours as needed for severe pain.  Marland Kitchen ibuprofen (ADVIL) 800 MG tablet Take 1 tablet (800 mg total) by mouth 3 (three) times daily.  . meclizine (ANTIVERT) 12.5 MG tablet Take 1 tablet (12.5 mg total) by mouth daily as needed for dizziness.  Marland Kitchen omeprazole (PRILOSEC) 20 MG capsule TAKE 1 CAPSULE BY MOUTH DAILY.   No facility-administered encounter medications on file as of 06/19/2020.      Observations/Objective: No direct observation done as this was a telephone encounter.  Patient does not sound congested. Results for orders placed or performed in visit on 14/78/29  CYCLIC CITRUL PEPTIDE ANTIBODY, IGG/IGA  Result Value Ref Range   Cyclic Citrullin Peptide Ab 6 0 - 19 units  Rheumatoid factor  Result Value Ref Range   Rhuematoid fact SerPl-aCnc 125.0 (H) 0.0 - 13.9 IU/mL  Hemoglobin A1c  Result Value Ref Range   Hgb A1c MFr Bld 5.9 (H) 4.8 - 5.6 %   Est. average glucose Bld gHb Est-mCnc 123  mg/dL  Lipid panel  Result Value Ref Range   Cholesterol, Total 177 100 - 199 mg/dL   Triglycerides 111 0 - 149 mg/dL   HDL 51 >39 mg/dL   VLDL Cholesterol Cal 20 5 - 40 mg/dL   LDL Chol Calc (NIH) 106 (H) 0 - 99 mg/dL   Chol/HDL Ratio 3.5 0.0 - 4.4 ratio  Comprehensive metabolic panel  Result Value Ref Range   Glucose 109 (H) 65 - 99 mg/dL   BUN 16 8 - 27 mg/dL   Creatinine, Ser 0.84 0.57 - 1.00 mg/dL   GFR calc non Af Amer 75 >59 mL/min/1.73   GFR calc Af Amer 86 >59 mL/min/1.73    BUN/Creatinine Ratio 19 12 - 28   Sodium 141 134 - 144 mmol/L   Potassium 4.2 3.5 - 5.2 mmol/L   Chloride 104 96 - 106 mmol/L   CO2 22 20 - 29 mmol/L   Calcium 9.3 8.7 - 10.3 mg/dL   Total Protein 7.5 6.0 - 8.5 g/dL   Albumin 4.3 3.8 - 4.8 g/dL   Globulin, Total 3.2 1.5 - 4.5 g/dL   Albumin/Globulin Ratio 1.3 1.2 - 2.2   Bilirubin Total 0.3 0.0 - 1.2 mg/dL   Alkaline Phosphatase 112 39 - 117 IU/L   AST 19 0 - 40 IU/L   ALT 19 0 - 32 IU/L  CBC  Result Value Ref Range   WBC 5.8 3.4 - 10.8 x10E3/uL   RBC 4.55 3.77 - 5.28 x10E6/uL   Hemoglobin 13.9 11.1 - 15.9 g/dL   Hematocrit 41.3 34.0 - 46.6 %   MCV 91 79 - 97 fL   MCH 30.5 26.6 - 33.0 pg   MCHC 33.7 31.5 - 35.7 g/dL   RDW 12.6 11.7 - 15.4 %   Platelets 299 150 - 450 x10E3/uL     Assessment and Plan: 1. Viral respiratory illness I suspect that she may have had COVID.  She declines testing since symptoms have been present for 2 weeks and she is now getting better. - benzonatate (TESSALON) 100 MG capsule; Take 1 capsule (100 mg total) by mouth 2 (two) times daily as needed for cough.  Dispense: 20 capsule; Refill: 0  2. Pharyngitis, unspecified etiology Recommend that she gargle with warm water mixed with salt.  Likely viral but patient requesting antibiotics. - amoxicillin (AMOXIL) 500 MG capsule; Take 1 capsule (500 mg total) by mouth 3 (three) times daily.  Dispense: 21 capsule; Refill: 0  3. Right upper quadrant abdominal pain Symptoms suspicious for biliary colic. We will try to get abdominal ultrasound done within the next few days.  Patient to come to the lab to have blood test done including lipase and LFTs.  Further management will be based on results of the studies. - US Abdomen Limited RUQ (LIVER/GB); Future - Lipase  4. Gastroesophageal reflux disease without esophagitis Symptoms also suggest component of GERD.  GERD precautions discussed including foods to avoid.  Stop any and all NSAIDs. - omeprazole (PRILOSEC)  20 MG capsule; Take 1 capsule (20 mg total) by mouth daily.  Dispense: 30 capsule; Refill: 6  5. Essential hypertension At goal.  Continue amlodipine - CBC; Future - Comprehensive metabolic panel; Future - Lipid panel; Future   Follow Up Instructions: 3 mths or sooner if any worsening.   I discussed the assessment and treatment plan with the patient. The patient was provided an opportunity to ask questions and all were answered. The patient agreed with the plan and demonstrated  an understanding of the instructions.   The patient was advised to call back or seek an in-person evaluation if the symptoms worsen or if the condition fails to improve as anticipated.  I  Spent 32 minutes on this telephone encounter  Karle Plumber, MD

## 2020-06-21 ENCOUNTER — Ambulatory Visit (HOSPITAL_COMMUNITY): Payer: No Typology Code available for payment source

## 2020-06-26 ENCOUNTER — Other Ambulatory Visit: Payer: Self-pay

## 2020-06-26 ENCOUNTER — Ambulatory Visit (HOSPITAL_COMMUNITY)
Admission: RE | Admit: 2020-06-26 | Discharge: 2020-06-26 | Disposition: A | Discharge status: Home / Self Care | Payer: Self-pay | Source: Ambulatory Visit | Attending: Internal Medicine | Admitting: Internal Medicine

## 2020-06-26 DIAGNOSIS — R1011 Right upper quadrant pain: Secondary | ICD-10-CM

## 2020-06-27 ENCOUNTER — Ambulatory Visit: Payer: Self-pay | Attending: Internal Medicine

## 2020-06-27 ENCOUNTER — Telehealth: Payer: Self-pay | Admitting: Internal Medicine

## 2020-06-27 ENCOUNTER — Other Ambulatory Visit: Payer: Self-pay

## 2020-06-27 DIAGNOSIS — I10 Essential (primary) hypertension: Secondary | ICD-10-CM

## 2020-06-27 NOTE — Telephone Encounter (Signed)
Will forward to provider  

## 2020-06-27 NOTE — Telephone Encounter (Signed)
Pt  Is asking if she could get a shot for shingles. She's got 5 bumps on sternum and 2 in the back and 1 on the rib. Please advise and thank you

## 2020-06-28 LAB — COMPREHENSIVE METABOLIC PANEL
ALT: 28 IU/L (ref 0–32)
AST: 20 IU/L (ref 0–40)
Albumin/Globulin Ratio: 1.3 (ref 1.2–2.2)
Albumin: 4.2 g/dL (ref 3.8–4.8)
Alkaline Phosphatase: 111 IU/L (ref 44–121)
BUN/Creatinine Ratio: 23 (ref 12–28)
BUN: 18 mg/dL (ref 8–27)
Bilirubin Total: 0.2 mg/dL (ref 0.0–1.2)
CO2: 23 mmol/L (ref 20–29)
Calcium: 9.4 mg/dL (ref 8.7–10.3)
Chloride: 105 mmol/L (ref 96–106)
Creatinine, Ser: 0.8 mg/dL (ref 0.57–1.00)
Globulin, Total: 3.2 g/dL (ref 1.5–4.5)
Glucose: 100 mg/dL — ABNORMAL HIGH (ref 65–99)
Potassium: 4.4 mmol/L (ref 3.5–5.2)
Sodium: 142 mmol/L (ref 134–144)
Total Protein: 7.4 g/dL (ref 6.0–8.5)
eGFR: 83 mL/min/{1.73_m2} (ref >59)

## 2020-06-28 LAB — LIPID PANEL
Chol/HDL Ratio: 4.5 ratio — ABNORMAL HIGH (ref 0.0–4.4)
Cholesterol, Total: 199 mg/dL (ref 100–199)
HDL: 44 mg/dL (ref >39)
LDL Chol Calc (NIH): 131 mg/dL — ABNORMAL HIGH (ref 0–99)
Triglycerides: 135 mg/dL (ref 0–149)
VLDL Cholesterol Cal: 24 mg/dL (ref 5–40)

## 2020-06-28 LAB — CBC
Hematocrit: 40.8 % (ref 34.0–46.6)
Hemoglobin: 13.5 g/dL (ref 11.1–15.9)
MCH: 30.1 pg (ref 26.6–33.0)
MCHC: 33.1 g/dL (ref 31.5–35.7)
MCV: 91 fL (ref 79–97)
Platelets: 321 10*3/uL (ref 150–450)
RBC: 4.48 x10E6/uL (ref 3.77–5.28)
RDW: 12.5 % (ref 11.7–15.4)
WBC: 6.5 10*3/uL (ref 3.4–10.8)

## 2020-06-28 NOTE — Telephone Encounter (Signed)
Tried contacting pt to go over provider message and schedule an appt pt didn't answer and lvm

## 2020-07-07 IMAGING — MG DIGITAL SCREENING BILAT W/ TOMO W/ CAD
8 series · 9 of 24 positions shown · non-contrast
Comparison: Previous exam(s).

CLINICAL DATA: Screening.

EXAM:
DIGITAL SCREENING BILATERAL MAMMOGRAM WITH TOMO AND CAD

[R CC synth-2D]
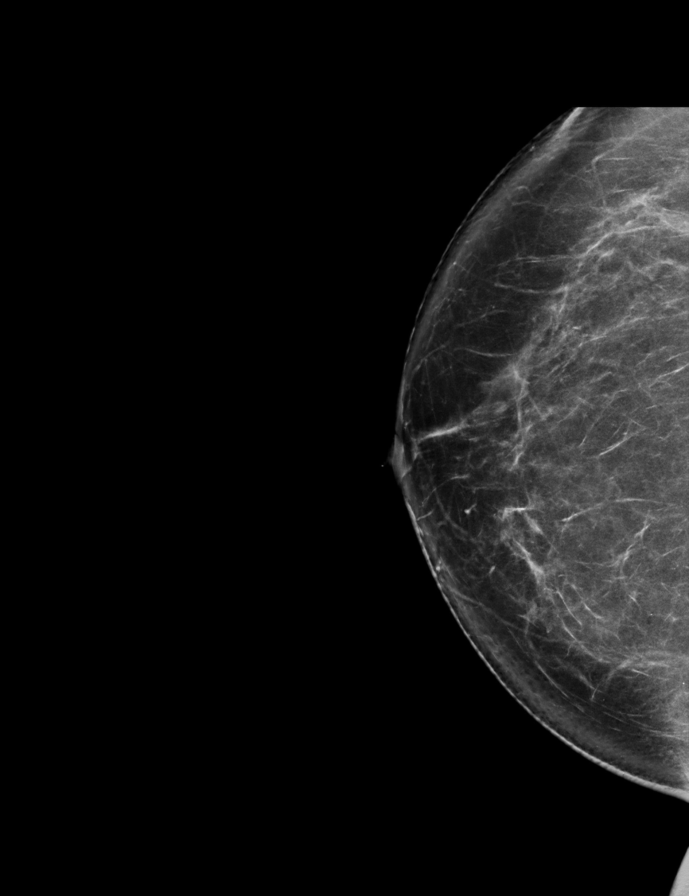

[L MLO synth-2D]
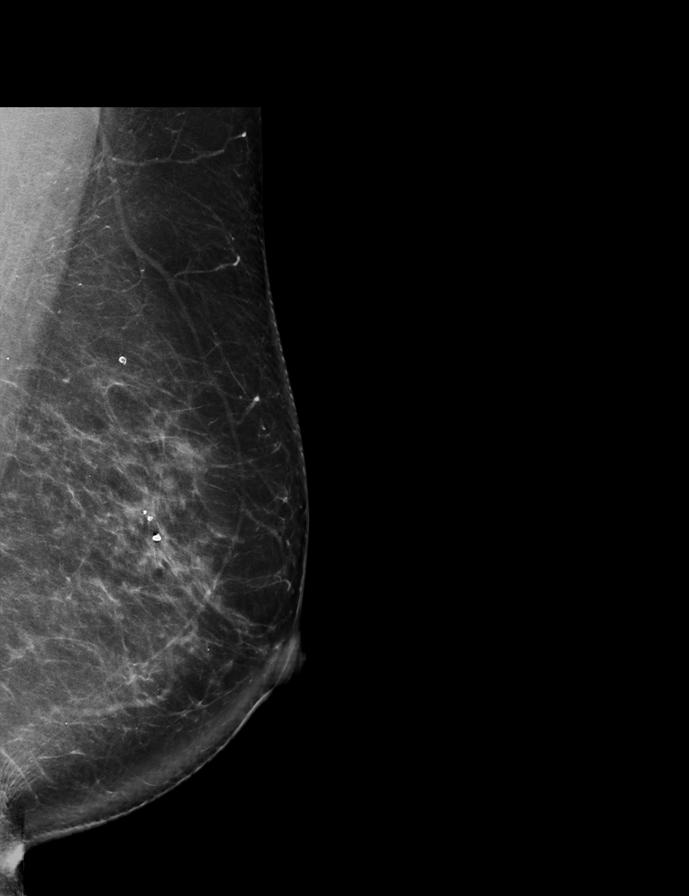

[L CC synth-2D]
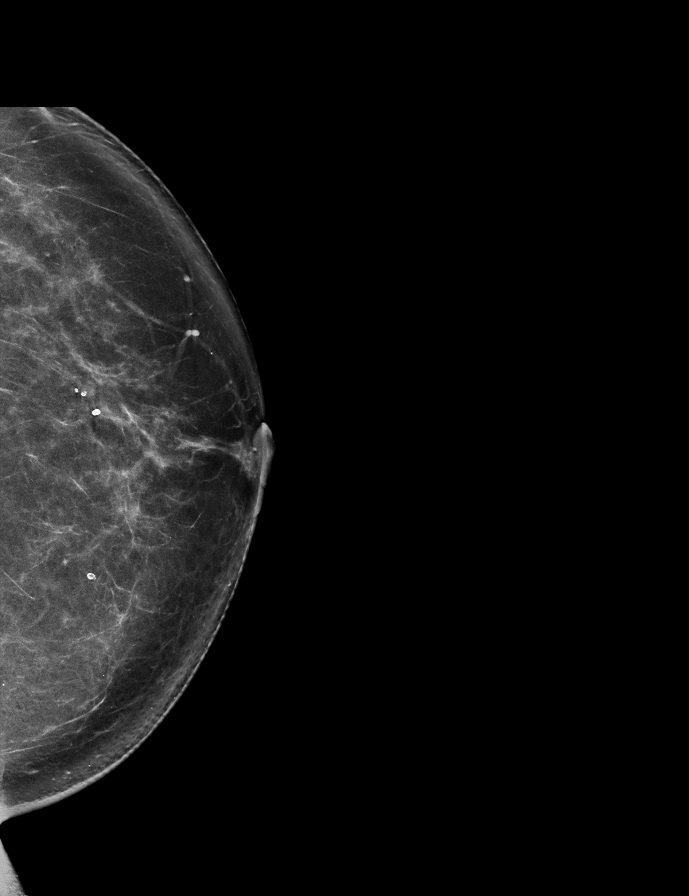

[R MLO synth-2D]
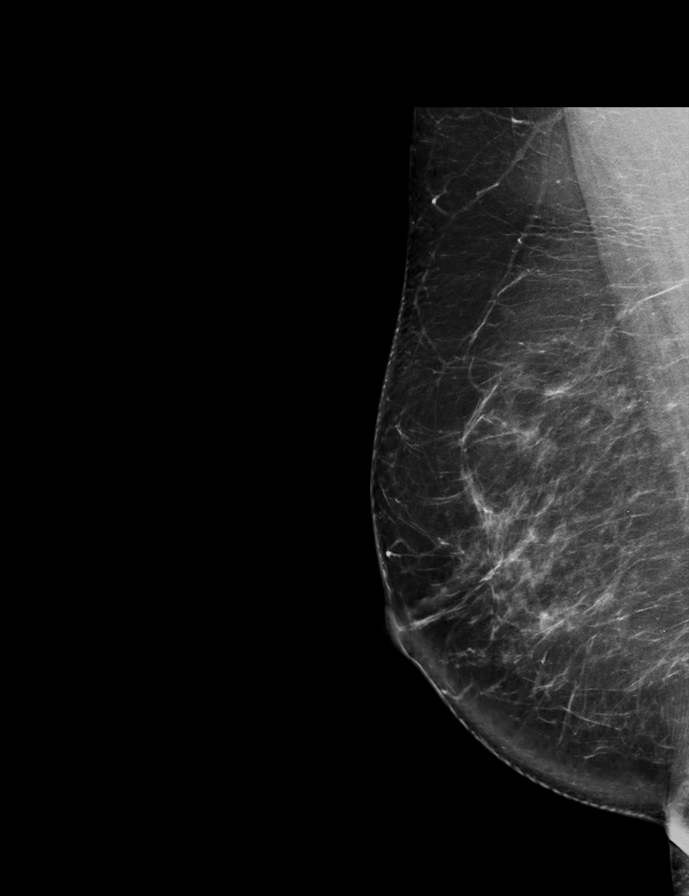

[R MLO tomo · 2 of 80 frames shown]
[frame 26/80]
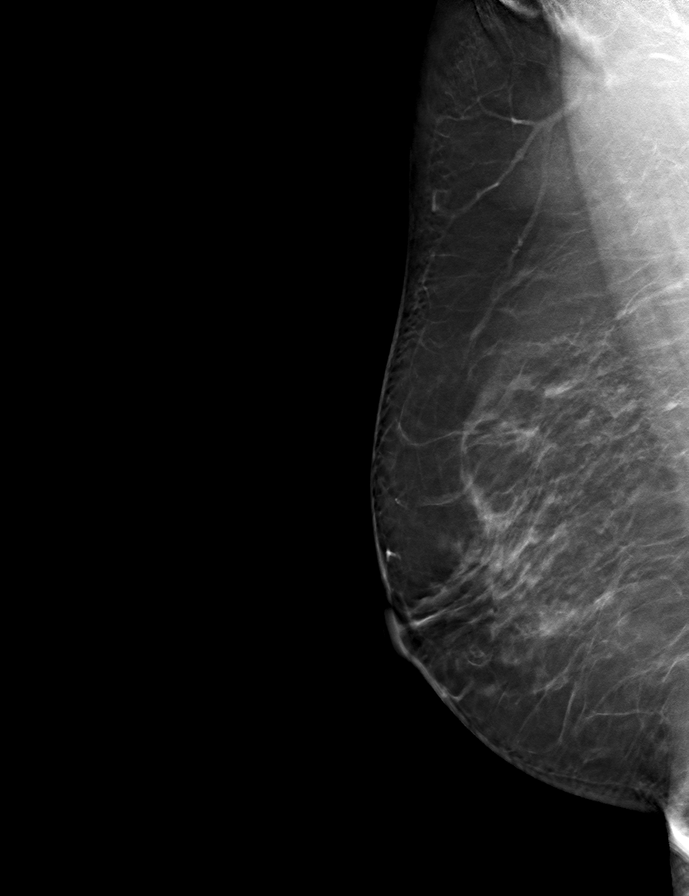
[frame 41/80]
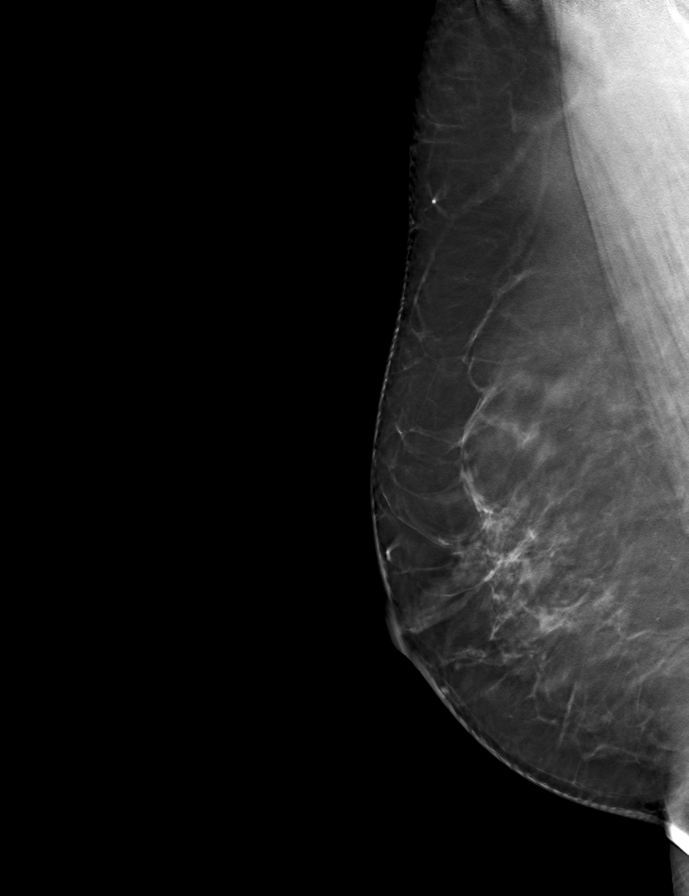

[L CC tomo · tomo slice 39/77.0]
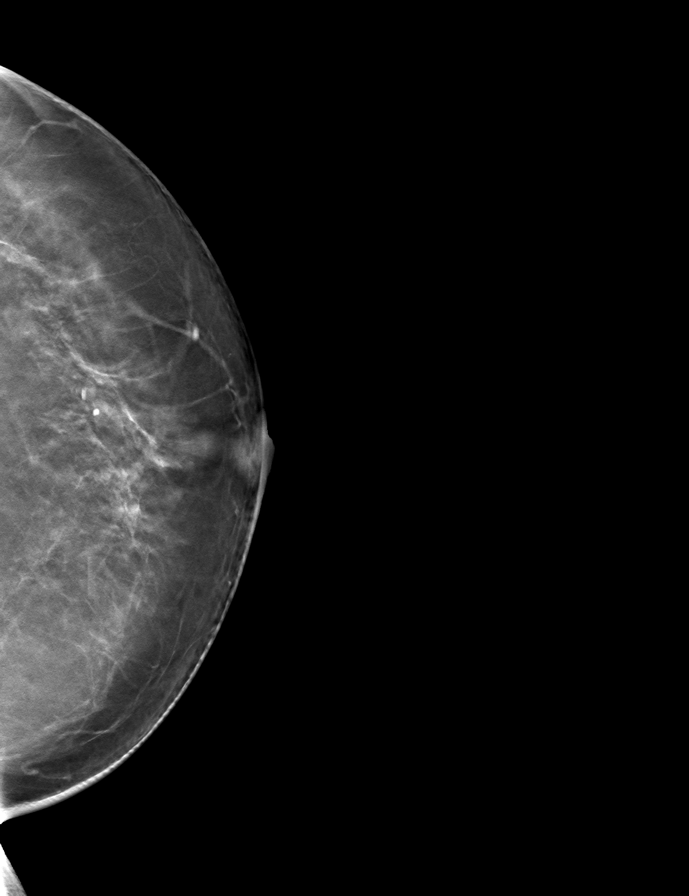

[R CC tomo · tomo slice 41/80.0]
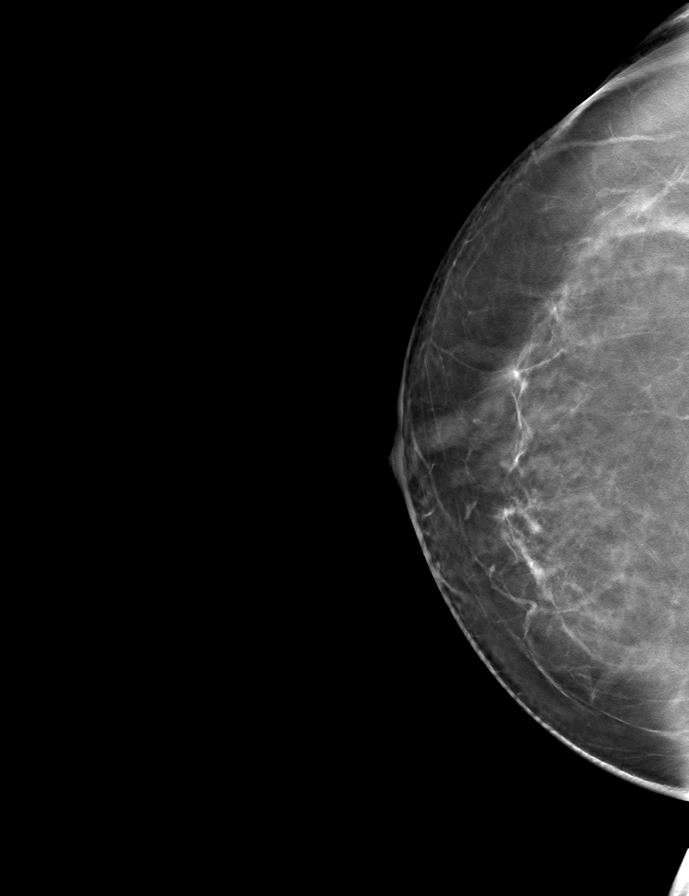

[L MLO tomo · tomo slice 41/80.0]
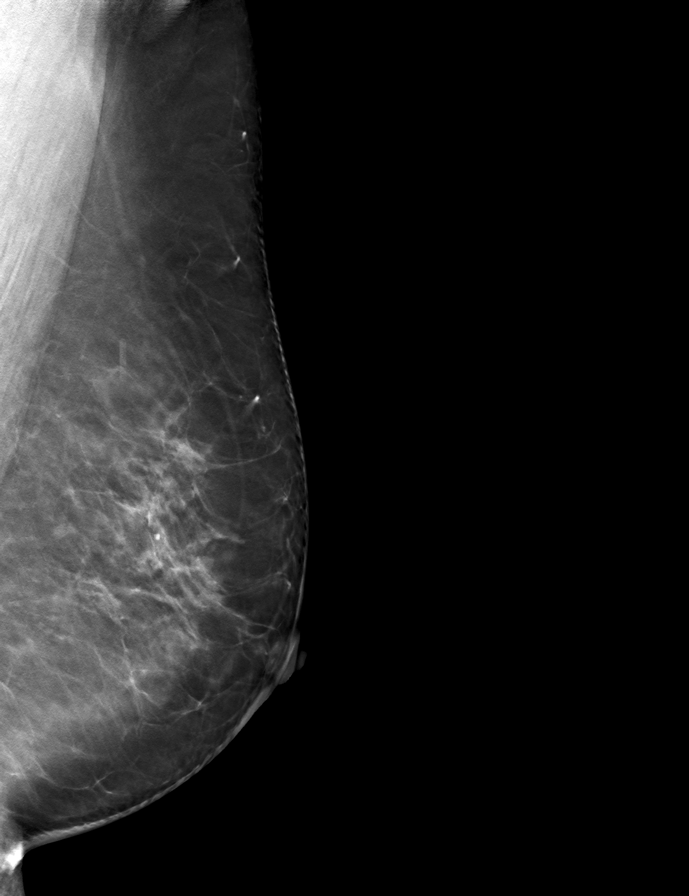

[9 of 24 positions shown; findings below may reference images not displayed]

ACR Breast Density Category b: There are scattered areas of
fibroglandular density.
FINDINGS: There are no findings suspicious for malignancy. Images were
processed with CAD.
IMPRESSION: No mammographic evidence of malignancy. A result letter of this
screening mammogram will be mailed directly to the patient.

RECOMMENDATION:
Screening mammogram in one year. (Code:[id number])

BI-RADS CATEGORY  1: Negative.

## 2020-12-31 ENCOUNTER — Ambulatory Visit: Payer: No Typology Code available for payment source | Attending: Internal Medicine

## 2020-12-31 ENCOUNTER — Other Ambulatory Visit: Payer: Self-pay

## 2021-01-09 ENCOUNTER — Encounter: Payer: Self-pay | Admitting: Physician Assistant

## 2021-01-09 ENCOUNTER — Other Ambulatory Visit: Payer: Self-pay

## 2021-01-09 ENCOUNTER — Ambulatory Visit: Payer: Self-pay | Attending: Physician Assistant | Admitting: Physician Assistant

## 2021-01-09 VITALS — BP 159/89 | HR 74 | Resp 16 | Wt 138.4 lb

## 2021-01-09 DIAGNOSIS — M24849 Other specific joint derangements of unspecified hand, not elsewhere classified: Secondary | ICD-10-CM

## 2021-01-09 DIAGNOSIS — E7800 Pure hypercholesterolemia, unspecified: Secondary | ICD-10-CM

## 2021-01-09 DIAGNOSIS — K219 Gastro-esophageal reflux disease without esophagitis: Secondary | ICD-10-CM

## 2021-01-09 DIAGNOSIS — Z789 Other specified health status: Secondary | ICD-10-CM

## 2021-01-09 DIAGNOSIS — I10 Essential (primary) hypertension: Secondary | ICD-10-CM

## 2021-01-09 DIAGNOSIS — M1990 Unspecified osteoarthritis, unspecified site: Secondary | ICD-10-CM

## 2021-01-09 DIAGNOSIS — Z23 Encounter for immunization: Secondary | ICD-10-CM

## 2021-01-09 DIAGNOSIS — R7303 Prediabetes: Secondary | ICD-10-CM

## 2021-01-09 MED ORDER — AMLODIPINE BESYLATE 5 MG PO TABS
5.0000 mg | ORAL_TABLET | Freq: Every day | ORAL | 4 refills | Status: DC
  Filled 2021-01-09: qty 30, 30d supply, fill #0
  Filled 2021-04-15: qty 30, 30d supply, fill #1
  Filled 2021-04-15: qty 30, 30d supply, fill #0
  Filled 2021-05-13: qty 30, 30d supply, fill #1
  Filled 2021-06-10: qty 30, 30d supply, fill #2
  Filled 2021-07-09: qty 30, 30d supply, fill #3

## 2021-01-09 MED ORDER — OMEPRAZOLE 20 MG PO CPDR
20.0000 mg | DELAYED_RELEASE_CAPSULE | Freq: Every day | ORAL | 6 refills | Status: DC
  Filled 2021-01-09 – 2021-04-15 (×2): qty 30, 30d supply, fill #0
  Filled 2021-04-15 – 2021-05-13 (×2): qty 30, 30d supply, fill #1
  Filled 2021-06-10: qty 30, 30d supply, fill #2
  Filled 2021-07-09: qty 30, 30d supply, fill #3
  Filled 2021-09-16: qty 30, 30d supply, fill #4

## 2021-01-09 MED ORDER — DICLOFENAC SODIUM 75 MG PO TBEC
75.0000 mg | DELAYED_RELEASE_TABLET | Freq: Two times a day (BID) | ORAL | 0 refills | Status: DC
  Filled 2021-01-09: qty 60, 30d supply, fill #0

## 2021-01-09 NOTE — Progress Notes (Signed)
Patient ID: Veronica Hart, female   DOB: [birth year], 65 y.o.   MRN: [id number]   Bassheva Flury, is a 65 y.o. female  FOY:[id number]  VEH:[id number]  DOB - [birth year]  Chief Complaint  Patient presents with   Arthritis       Subjective:   Raynell Upton is a 65 y.o. female here today for med RF and bc her middle fingers on B hands have been locking.  She needs medication fro arthritis.  She has not been taking any meds bc she did not understand how to call the pharmacy to call in for her RF.   No HA/CP/dizziness.  No problems updated.  ALLERGIES: Allergies  Allergen Reactions   Hydroxychloroquine     Causes diarrhea, blurred vision and weakness.    PAST MEDICAL HISTORY: Past Medical History:  Diagnosis Date   Allergy    seasonal   Arthritis     MEDICATIONS AT HOME: Prior to Admission medications   Medication Sig Start Date End Date Taking? Authorizing Provider  diclofenac (VOLTAREN) 75 MG EC tablet Take 1 tablet (75 mg total) by mouth 2 (two) times daily. 01/09/21  Yes Freeman Caldron M, PA-C  diclofenac Sodium (VOLTAREN) 1 % GEL APPLY 2 G TOPICALLY 4 (FOUR) TIMES DAILY. 02/21/20 02/20/21 Yes Ladell Pier, MD  fluticasone (FLONASE) 50 MCG/ACT nasal spray PLACE 1 SPRAY INTO BOTH NOSTRILS DAILY. 02/21/20 02/20/21 Yes Ladell Pier, MD  meclizine (ANTIVERT) 12.5 MG tablet Take 1 tablet (12.5 mg total) by mouth daily as needed for dizziness. 05/10/19  Yes Ladell Pier, MD  amLODipine (NORVASC) 5 MG tablet Take 1 tablet (5 mg total) by mouth daily. 01/09/21   Argentina Donovan, PA-C  benzonatate (TESSALON) 100 MG capsule Take 1 capsule (100 mg total) by mouth 2 (two) times daily as needed for cough. Patient not taking: Reported on 01/09/2021 06/19/20   Ladell Pier, MD  omeprazole (PRILOSEC) 20 MG capsule Take 1 capsule (20 mg total) by mouth daily. 01/09/21   Baley Shands, Dionne Bucy, PA-C    ROS: Neg HEENT Neg resp Neg cardiac Neg GI Neg  GU Neg psych Neg neuro  Objective:   Vitals:   01/09/21 0902  BP: (!) 159/89  Pulse: 74  Resp: 16  SpO2: 97%  Weight: 138 lb 6.4 oz (62.8 kg)   Exam General appearance : Awake, alert, not in any distress. Speech Clear. Not toxic looking HEENT: Atraumatic and Normocephalic Neck: Supple, no JVD. No cervical lymphadenopathy.  Chest: Good air entry bilaterally, CTAB.  No rales/rhonchi/wheezing CVS: S1 S2 regular, no murmurs.  Extremities: B/L Lower Ext shows no edema, both legs are warm to touch Neurology: Awake alert, and oriented X 3, CN II-XII intact, Non focal Skin: No Rash  Data Review Lab Results  Component Value Date   HGBA1C 5.9 (H) 05/16/2019   HGBA1C 5.6 03/24/2017   HGBA1C 5.9 11/21/2013    Assessment & Plan   1. Essential hypertension Not controlled; out of meds.  Patient education on how to call for RF and patient verbalizes understanding.  Extra time for visit required for this - Comprehensive metabolic panel - amLODipine (NORVASC) 5 MG tablet; Take 1 tablet (5 mg total) by mouth daily.  Dispense: 30 tablet; Refill: 4  2. Prediabetes I have had a lengthy discussion and provided education about insulin resistance and the intake of too much sugar/refined carbohydrates.  I have advised the patient to work at a goal of eliminating sugary drinks, candy, desserts, sweets, refined  sugars, processed foods, and white carbohydrates.  The patient expresses understanding.   - Comprehensive metabolic panel - Hemoglobin A1c  3. Language barrier, speaks Spanish only [employer] "Allena Katz" interpreters used and additional time performing visit was required.   4. Elevated LDL cholesterol level - Comprehensive metabolic panel - Lipid panel  5. Locking finger joint - Ambulatory referral to Hand Surgery - diclofenac (VOLTAREN) 75 MG EC tablet; Take 1 tablet (75 mg total) by mouth 2 (two) times daily.  Dispense: 60 tablet; Refill: 0  6. Gastroesophageal reflux disease without  esophagitis - omeprazole (PRILOSEC) 20 MG capsule; Take 1 capsule (20 mg total) by mouth daily.  Dispense: 30 capsule; Refill: 6  7. Arthritis - diclofenac (VOLTAREN) 75 MG EC tablet; Take 1 tablet (75 mg total) by mouth 2 (two) times daily.  Dispense: 60 tablet; Refill: 0    Patient have been counseled extensively about nutrition and exercise. Other issues discussed during this visit include: low cholesterol diet, weight control and daily exercise, foot care, annual eye examinations at Ophthalmology, importance of adherence with medications and regular follow-up. We also discussed long term complications of uncontrolled diabetes and hypertension.   Return for 3 weeks for blood pressure follow up with Lurena Joiner and 4-5 months with PCP.  The patient was given clear instructions to go to ER or return to medical center if symptoms don't improve, worsen or new problems develop. The patient verbalized understanding. The patient was told to call to get lab results if they haven't heard anything in the next week.      Freeman Caldron, PA-C [facility] [place], [place]   01/09/2021, 9:18 AM

## 2021-01-10 ENCOUNTER — Other Ambulatory Visit: Payer: Self-pay | Admitting: Physician Assistant

## 2021-01-10 ENCOUNTER — Other Ambulatory Visit: Payer: Self-pay

## 2021-01-10 DIAGNOSIS — R7303 Prediabetes: Secondary | ICD-10-CM

## 2021-01-10 LAB — HEMOGLOBIN A1C
Est. average glucose Bld gHb Est-mCnc: 126 mg/dL
Hgb A1c MFr Bld: 6 % — ABNORMAL HIGH (ref 4.8–5.6)

## 2021-01-10 LAB — COMPREHENSIVE METABOLIC PANEL
ALT: 21 IU/L (ref 0–32)
AST: 22 IU/L (ref 0–40)
Albumin/Globulin Ratio: 1.4 (ref 1.2–2.2)
Albumin: 4.3 g/dL (ref 3.8–4.8)
Alkaline Phosphatase: 115 IU/L (ref 44–121)
BUN/Creatinine Ratio: 16 (ref 12–28)
BUN: 12 mg/dL (ref 8–27)
Bilirubin Total: 0.3 mg/dL (ref 0.0–1.2)
CO2: 24 mmol/L (ref 20–29)
Calcium: 9.3 mg/dL (ref 8.7–10.3)
Chloride: 105 mmol/L (ref 96–106)
Creatinine, Ser: 0.75 mg/dL (ref 0.57–1.00)
Globulin, Total: 3.1 g/dL (ref 1.5–4.5)
Glucose: 100 mg/dL — ABNORMAL HIGH (ref 70–99)
Potassium: 4.4 mmol/L (ref 3.5–5.2)
Sodium: 141 mmol/L (ref 134–144)
Total Protein: 7.4 g/dL (ref 6.0–8.5)
eGFR: 89 mL/min/{1.73_m2} (ref >59)

## 2021-01-10 LAB — LIPID PANEL
Chol/HDL Ratio: 3 ratio (ref 0.0–4.4)
Cholesterol, Total: 176 mg/dL (ref 100–199)
HDL: 58 mg/dL (ref >39)
LDL Chol Calc (NIH): 101 mg/dL — ABNORMAL HIGH (ref 0–99)
Triglycerides: 94 mg/dL (ref 0–149)
VLDL Cholesterol Cal: 17 mg/dL (ref 5–40)

## 2021-01-10 MED ORDER — METFORMIN HCL 500 MG PO TABS
500.0000 mg | ORAL_TABLET | Freq: Every day | ORAL | 1 refills | Status: DC
  Filled 2021-01-10: qty 30, 30d supply, fill #0
  Filled 2021-01-21: qty 90, 90d supply, fill #0
  Filled 2021-04-15: qty 90, 90d supply, fill #1
  Filled 2021-04-15: qty 30, 30d supply, fill #0

## 2021-01-15 ENCOUNTER — Ambulatory Visit (INDEPENDENT_AMBULATORY_CARE_PROVIDER_SITE_OTHER): Payer: Self-pay | Admitting: Orthopedic Surgery

## 2021-01-15 ENCOUNTER — Encounter: Payer: Self-pay | Admitting: Orthopedic Surgery

## 2021-01-15 ENCOUNTER — Other Ambulatory Visit: Payer: Self-pay

## 2021-01-15 DIAGNOSIS — M65331 Trigger finger, right middle finger: Secondary | ICD-10-CM

## 2021-01-15 DIAGNOSIS — M65332 Trigger finger, left middle finger: Secondary | ICD-10-CM

## 2021-01-15 MED ORDER — LIDOCAINE HCL 1 % IJ SOLN
1.0000 mL | INTRAMUSCULAR | Status: AC | PRN
Start: 2021-01-15 — End: 2021-01-15
  Administered 2021-01-15: 1 mL

## 2021-01-15 MED ORDER — BETAMETHASONE SOD PHOS & ACET 6 (3-3) MG/ML IJ SUSP
6.0000 mg | INTRAMUSCULAR | Status: AC | PRN
Start: 2021-01-15 — End: 2021-01-15
  Administered 2021-01-15: 6 mg via INTRA_ARTICULAR

## 2021-01-15 NOTE — Progress Notes (Signed)
Office Visit Note   Patient: Veronica Hart           Date of Birth: 1955/05/01           MRN: [id number] Visit Date: 01/15/2021              Requested by: Argentina Donovan, PA-C [place],  [place] [address] PCP: Ladell Pier, MD   Assessment & Plan: Visit Diagnoses:  1. Trigger finger, right middle finger   2. Trigger finger, left middle finger     Plan: We discussed the diagnosis, prognosis, non-operative and operative treatment options for trigger finger.  After our discussion, the patient would like to proceed with corticosteroid injection.  We reviewed the risks and benefits of conservative management.  The patient expressed understanding of the reasoning and strategy going forward.  All patient questions and concerns were addressed.    Follow-Up Instructions: No follow-ups on file.   Orders:  No orders of the defined types were placed in this encounter.  No orders of the defined types were placed in this encounter.     Procedures: Hand/UE Inj: R long A1 (L long A1) for trigger finger on 01/15/2021 10:17 AM Indications: tendon swelling Details: 25 G needle, volar approach Medications: 1 mL lidocaine 1 %; 6 mg betamethasone acetate-betamethasone sodium phosphate 6 (3-3) MG/ML Outcome: tolerated well, no immediate complications  Repeated procedure for L middle finger A1 pulley Procedure, treatment alternatives, risks and benefits explained, specific risks discussed. Consent was given by the patient. Immediately prior to procedure a time out was called to verify the correct patient, procedure, equipment, support staff and site/side marked as required. Patient was prepped and draped in the usual sterile fashion.      Clinical Data: No additional findings.   Subjective: Chief Complaint  Patient presents with   Left Middle Finger - Pain    Lock when she bends it,+ swelling and weakness, no N/T, Pain 6/10, trouble with pulling things and  opening bottles   Right Middle Finger - Pain    Lock when she bends it,+ swelling and weakness, no N/T, Pain 6/10, trouble with pulling things and opening bottles    This is a 65 year old right-hand-dominant female who presents with triggering of bilateral middle fingers.  This is been going on for about 5 months.  The right is more symptomatic than the left.  She has trouble with pulling things or opening bottles.  It is quite painful and get stuck.  She does not have any treatment so far.   Review of Systems   Objective: Vital Signs: BP (!) 146/84 (BP Location: Left Arm, Patient Position: Sitting)   Pulse 82   Wt 138 lb 6.4 oz (62.8 kg)   BMI 27.95 kg/m   Physical Exam Constitutional:      Appearance: Normal appearance.  Cardiovascular:     Rate and Rhythm: Normal rate.     Pulses: Normal pulses.  Pulmonary:     Effort: Pulmonary effort is normal.  Skin:    General: Skin is warm and dry.     Capillary Refill: Capillary refill takes less than 2 seconds.  Neurological:     Mental Status: She is alert.    Right Hand Exam   Tenderness  Right hand tenderness location: TTP over middle finger A1 pulley.  Range of Motion  The patient has normal right wrist ROM.   Muscle Strength  The patient has normal right wrist strength.  Other  Erythema: absent Sensation: normal Pulse: present  Comments:  Visible and palpable triggering of middle finger   Left Hand Exam   Tenderness  Left hand tenderness location: TTP over middle finger A1 pulley.   Range of Motion  The patient has normal left wrist ROM.  Muscle Strength  The patient has normal left wrist strength.  Other  Erythema: absent Sensation: normal Pulse: present  Comments:  Visible and palpable triggering of middle finger     Specialty Comments:  No specialty comments available.  Imaging: No results found.   PMFS History: Patient Active Problem List   Diagnosis Date Noted   Trigger finger,  right middle finger 01/15/2021   Trigger finger, left middle finger 01/15/2021   Upper airway cough syndrome 04/20/2018   Lateral epicondylitis of right elbow 03/30/2018   Right elbow pain 03/23/2018   Rheumatoid arthritis involving multiple sites with positive rheumatoid factor ([place]) 12/15/2016   Benign paroxysmal positional vertigo due to bilateral vestibular disorder 09/15/2016   Elevated rheumatoid factor 05/08/2016   Arthritis of left hip 05/05/2016   Environmental allergies 05/05/2016   Allergic rhinitis 11/21/2013   Prediabetes 05/17/2013   Language barrier, speaks Spanish only 02/21/2013   Arthritis 02/14/2013   Past Medical History:  Diagnosis Date   Allergy    seasonal   Arthritis     Family History  Problem Relation Age of Onset   Arthritis Mother    Colon cancer Neg Hx     Past Surgical History:  Procedure Laterality Date   TUBAL LIGATION     Social History   Occupational History   Not on file  Tobacco Use   Smoking status: Never   Smokeless tobacco: Never  Substance and Sexual Activity   Alcohol use: No    Alcohol/week: 0.0 standard drinks   Drug use: No   Sexual activity: Not Currently    Birth control/protection: Surgical

## 2021-01-17 ENCOUNTER — Other Ambulatory Visit: Payer: Self-pay

## 2021-01-21 ENCOUNTER — Other Ambulatory Visit: Payer: Self-pay

## 2021-01-22 ENCOUNTER — Other Ambulatory Visit: Payer: Self-pay

## 2021-01-23 ENCOUNTER — Other Ambulatory Visit: Payer: Self-pay

## 2021-01-28 NOTE — Progress Notes (Signed)
   S:    Patient arrives in good spirits. Presents to the clinic for hypertension evaluation, counseling, and management. Patient was referred and last seen by Primary Care Provider Freeman Caldron on 01/09/2021. At that time, the patient reported being out of medications.  Medication adherence reported .  Current BP Medications include:   -Amlodipine 5 mg QD  Antihypertensives tried in the past include: none  Dietary habits include: Patient reports drinking coffee with cookies in the morning and nothing again until 7 pm, which is usually eggs with tortillas.  Exercise habits include:none Family / Social history: Mother - arthritis Never smoker  ASCVD risk factors include:HTN  O:  Vitals:   01/29/21 1114  BP: 123/81  Pulse: 76   Home BP readings: none. Patient does not have home BP monitoring  Blood pressure in clinic today is 123/81 with HR 76 bmp  Last 3 Office BP readings: BP Readings from Last 3 Encounters:  01/15/21 (!) 146/84  01/09/21 (!) 159/89  02/21/20 130/90    BMET    Component Value Date/Time   NA 141 01/09/2021 0933   K 4.4 01/09/2021 0933   CL 105 01/09/2021 0933   CO2 24 01/09/2021 0933   GLUCOSE 100 (H) 01/09/2021 0933   GLUCOSE 90 01/17/2013 0855   BUN 12 01/09/2021 0933   CREATININE 0.75 01/09/2021 0933   CREATININE 0.67 01/17/2013 0855   CALCIUM 9.3 01/09/2021 0933   GFRNONAA 75 05/16/2019 1013   GFRNONAA >89 01/17/2013 0855   GFRAA 86 05/16/2019 1013   GFRAA >89 01/17/2013 0855    Renal function: CrCl cannot be calculated (Unknown ideal weight.).  Clinical ASCVD: No  The 10-year ASCVD risk score (Arnett DK, et al., 2019) is: 7.9%   Values used to calculate the score:     Age: 65 years     Sex: Female     Is Non-Hispanic African American: No     Diabetic: No     Tobacco smoker: No     Systolic Blood Pressure: 703 mmHg     Is BP treated: Yes     HDL Cholesterol: 58 mg/dL     Total Cholesterol: 176 mg/dL   A/P: Hypertension  longstanding currently controlled on current medications. BP Goal = < 130/80 mmHg. Medication adherence reported.  -Continued amlodipine 5 mg dialy.  -F/u labs ordered - none -Counseled on lifestyle modifications for blood pressure control including reduced dietary sodium, increased exercise, adequate sleep.  Results reviewed and written information provided.   Total time in face-to-face counseling 30 minutes.  F/U Clinic Visit with pharmacist in January. Follow up with PCP in March.   Thank you for allowing pharmacy to participate in this patient's care.  Reatha Harps, PharmD PGY1 Pharmacy Resident 01/29/2021 11:22 AM

## 2021-01-29 ENCOUNTER — Ambulatory Visit: Payer: Self-pay | Attending: Internal Medicine | Admitting: Pharmacist

## 2021-01-29 ENCOUNTER — Other Ambulatory Visit: Payer: Self-pay

## 2021-01-29 ENCOUNTER — Encounter: Payer: Self-pay | Admitting: Pharmacist

## 2021-01-29 VITALS — BP 123/81 | HR 76

## 2021-01-29 DIAGNOSIS — I10 Essential (primary) hypertension: Secondary | ICD-10-CM

## 2021-04-15 ENCOUNTER — Other Ambulatory Visit: Payer: Self-pay

## 2021-04-16 ENCOUNTER — Other Ambulatory Visit: Payer: Self-pay

## 2021-05-13 ENCOUNTER — Ambulatory Visit: Payer: Self-pay | Attending: Internal Medicine | Admitting: Internal Medicine

## 2021-05-13 ENCOUNTER — Other Ambulatory Visit: Payer: Self-pay

## 2021-05-13 VITALS — BP 117/79 | HR 79 | Ht 59.0 in | Wt 132.0 lb

## 2021-05-13 DIAGNOSIS — E663 Overweight: Secondary | ICD-10-CM

## 2021-05-13 DIAGNOSIS — J302 Other seasonal allergic rhinitis: Secondary | ICD-10-CM

## 2021-05-13 DIAGNOSIS — M65331 Trigger finger, right middle finger: Secondary | ICD-10-CM

## 2021-05-13 DIAGNOSIS — I10 Essential (primary) hypertension: Secondary | ICD-10-CM

## 2021-05-13 DIAGNOSIS — K219 Gastro-esophageal reflux disease without esophagitis: Secondary | ICD-10-CM

## 2021-05-13 DIAGNOSIS — M65332 Trigger finger, left middle finger: Secondary | ICD-10-CM

## 2021-05-13 DIAGNOSIS — R7303 Prediabetes: Secondary | ICD-10-CM

## 2021-05-13 DIAGNOSIS — R058 Other specified cough: Secondary | ICD-10-CM

## 2021-05-13 LAB — POCT GLYCOSYLATED HEMOGLOBIN (HGB A1C): Hemoglobin A1C: 5.7 % — AB (ref 4.0–5.6)

## 2021-05-13 MED ORDER — METFORMIN HCL 500 MG PO TABS
500.0000 mg | ORAL_TABLET | Freq: Every day | ORAL | 1 refills | Status: DC
  Filled 2021-05-13: qty 90, 90d supply, fill #0
  Filled 2021-08-05: qty 90, 90d supply, fill #1

## 2021-05-13 MED ORDER — FLUTICASONE PROPIONATE 50 MCG/ACT NA SUSP
NASAL | 5 refills | Status: DC
  Filled 2021-05-13: qty 16, 25d supply, fill #0
  Filled 2021-06-10: qty 16, 25d supply, fill #1

## 2021-05-13 NOTE — Progress Notes (Signed)
Patient ID: Veronica Hart, female    DOB: March 18, 1955  MRN: [id number]  CC:  chronic ds management  Subjective: Veronica Hart is a 66 y.o. female who presents for chronic ds management Her concerns today include:  Patient with history of arthritis (saw a rheumatologist at [facility] 2021 and told she does not have RA), GERD, prediabetes, UACS/allergies, HL, HTN, preDM (dx 01/2021).  Today's visit is for chronic ds management  Saw ortho 01/2021 for trigger finger BL ring fingers.  Given steroid inj helped but started having pain again for past 1 mth.  Fingers getting stuck again especially in the mornings. Feels like fluid in the fingers.  On Voltaren which helps  PreDM:  dx with this on last visit with PA 01/2021.  A1C was 6.  Started on Metformin which she is tolerating.  A1C today is improved at 5.7 Trying to eat healthy Walks 2 days a wk and very active at work.  She works in a [employer] 4 days a wk  Loss 6 lbs since 01/2021  HTN:  Compliant with Norvasc. Checks BP 2x/wk.  Gives range 120s/70s Limits salt in foods NO CP/SOB/LE edema  Arthritis: tolerating Voltaren PO  Reports a lot of cough at night times with phlegm in throat x several days.  Grandson sleeps with her.  He was sick with cough but it has resolved.  He was not checked for COVID or flu Endorses itchy throat and ears and postnasal drip with the cough.  No sneezing or nasal congestion. Reports GERD under good control with Omeprazole.  Uses Omeprazole PRN "only when I feel burning in the stomach. Takes Claritin every night from OTC. Flonase on med list but she has not been using it in a while.  Cause dryness of nose and nose bleed when used every day.   Patient Active Problem List   Diagnosis Date Noted   Trigger finger, right middle finger 01/15/2021   Trigger finger, left middle finger 01/15/2021   Upper airway cough syndrome 04/20/2018   Lateral epicondylitis of right elbow 03/30/2018   Right elbow  pain 03/23/2018   Benign paroxysmal positional vertigo due to bilateral vestibular disorder 09/15/2016   Elevated rheumatoid factor 05/08/2016   Arthritis of left hip 05/05/2016   Environmental allergies 05/05/2016   Allergic rhinitis 11/21/2013   Prediabetes 05/17/2013   Language barrier, speaks Spanish only 02/21/2013   Arthritis 02/14/2013     Current Outpatient Medications on File Prior to Visit  Medication Sig Dispense Refill   amLODipine (NORVASC) 5 MG tablet Take 1 tablet (5 mg total) by mouth daily. 30 tablet 4   benzonatate (TESSALON) 100 MG capsule Take 1 capsule (100 mg total) by mouth 2 (two) times daily as needed for cough. 20 capsule 0   diclofenac (VOLTAREN) 75 MG EC tablet Take 1 tablet (75 mg total) by mouth 2 (two) times daily. 60 tablet 0   meclizine (ANTIVERT) 12.5 MG tablet Take 1 tablet (12.5 mg total) by mouth daily as needed for dizziness. 30 tablet 0   omeprazole (PRILOSEC) 20 MG capsule Take 1 capsule (20 mg total) by mouth daily. 30 capsule 6   No current facility-administered medications on file prior to visit.    Allergies  Allergen Reactions   Hydroxychloroquine     Causes diarrhea, blurred vision and weakness.    Social History   Socioeconomic History   Marital status: Married    Spouse name: Not on file   Number of children: Not  on file   Years of education: Not on file   Highest education level: Not on file  Occupational History   Not on file  Tobacco Use   Smoking status: Never   Smokeless tobacco: Never  Substance and Sexual Activity   Alcohol use: No    Alcohol/week: 0.0 standard drinks   Drug use: No   Sexual activity: Not Currently    Birth control/protection: Surgical  Other Topics Concern   Not on file  Social History Narrative   Not on file   Social Determinants of Health   Financial Resource Strain: Not on file  Food Insecurity: Not on file  Transportation Needs: Not on file  Physical Activity: Not on file  Stress:  Not on file  Social Connections: Not on file  Intimate Partner Violence: Not on file    Family History  Problem Relation Age of Onset   Arthritis Mother    Colon cancer Neg Hx     Past Surgical History:  Procedure Laterality Date   TUBAL LIGATION      ROS: Review of Systems Negative except as stated above  PHYSICAL EXAM: BP 117/79    Pulse 79    Ht '4\' 11"'$  (1.499 m)    Wt 132 lb (59.9 kg)    SpO2 100%    BMI 26.66 kg/m   Wt Readings from Last 3 Encounters:  05/13/21 132 lb (59.9 kg)  01/15/21 138 lb 6.4 oz (62.8 kg)  01/09/21 138 lb 6.4 oz (62.8 kg)    Physical Exam  General appearance - alert, well appearing, and in no distress Mental status - normal mood, behavior, speech, dress, motor activity, and thought processes Nose - normal and patent, no erythema, discharge or polyps Mouth - mucous membranes moist, pharynx normal without lesions Neck - supple, no significant adenopathy Chest - clear to auscultation, no wheezes, rales or rhonchi, symmetric air entry Heart - normal rate, regular rhythm, normal S1, S2, no murmurs, rubs, clicks or gallops Musculoskeletal - hands:  no edema of ring fingers.  No signs of active tenosynovisits  Depression screen [facility] 2/9 05/13/2021 01/09/2021 02/21/2020  Decreased Interest 0 0 0  Down, Depressed, Hopeless 0 0 0  PHQ - 2 Score 0 0 0  Altered sleeping - - -  Tired, decreased energy - - -  Change in appetite - - -  Feeling bad or failure about yourself  - - -  Trouble concentrating - - -  Moving slowly or fidgety/restless - - -  Suicidal thoughts - - -  PHQ-9 Score - - -  Some recent data might be hidden     CMP Latest Ref Rng & Units 01/09/2021 06/27/2020 05/16/2019  Glucose 70 - 99 mg/dL 100(H) 100(H) 109(H)  BUN 8 - 27 mg/dL '12 18 16  '$ Creatinine 0.57 - 1.00 mg/dL 0.75 0.80 0.84  Sodium 134 - 144 mmol/L 141 142 141  Potassium 3.5 - 5.2 mmol/L 4.4 4.4 4.2  Chloride 96 - 106 mmol/L 105 105 104  CO2 20 - 29 mmol/L '24 23 22   '$ Calcium 8.7 - 10.3 mg/dL 9.3 9.4 9.3  Total Protein 6.0 - 8.5 g/dL 7.4 7.4 7.5  Total Bilirubin 0.0 - 1.2 mg/dL 0.3 0.2 0.3  Alkaline Phos 44 - 121 IU/L 115 111 112  AST 0 - 40 IU/L '22 20 19  '$ ALT 0 - 32 IU/L '21 28 19   '$ Lipid Panel     Component Value Date/Time   CHOL 176 01/09/2021  0933   TRIG 94 01/09/2021 0933   HDL 58 01/09/2021 0933   CHOLHDL 3.0 01/09/2021 0933   CHOLHDL 3.5 11/21/2013 1749   VLDL 24 11/21/2013 1749   LDLCALC 101 (H) 01/09/2021 0933    CBC    Component Value Date/Time   WBC 6.5 06/27/2020 0923   WBC 5.7 05/05/2016 1007   RBC 4.48 06/27/2020 0923   RBC 4.56 05/05/2016 1007   HGB 13.5 06/27/2020 0923   HCT 40.8 06/27/2020 0923   PLT 321 06/27/2020 0923   MCV 91 06/27/2020 0923   MCH 30.1 06/27/2020 0923   MCH 29.8 05/05/2016 1007   MCHC 33.1 06/27/2020 0923   MCHC 33.3 05/05/2016 1007   RDW 12.5 06/27/2020 0923   LYMPHSABS 2.1 05/12/2017 1100   MONOABS 0.4 01/22/2010 2119   EOSABS 0.8 (H) 05/12/2017 1100   BASOSABS 0.1 05/12/2017 1100    ASSESSMENT AND PLAN: 1. Essential hypertension At goal.  Continue amlodipine and low-salt diet.  2. Overweight (BMI 25.0-29.9) 3. Prediabetes Commended her on weight loss so far.  Encouraged her to stay active.  Discussed and encourage healthy eating habits.  Continue metformin. - POCT glycosylated hemoglobin (Hb A1C) - metFORMIN (GLUCOPHAGE) 500 MG tablet; Take 1 tablet (500 mg total) by mouth daily with breakfast.  Dispense: 90 tablet; Refill: 1  4. Other cough Sounds like this is due to allergies and postnasal drip.  Continue loratadine.  I recommend using the Flonase nasal spray about twice a week.  Recommend taking omeprazole daily for the next several weeks in case acid reflux is also playing a role. - fluticasone (FLONASE) 50 MCG/ACT nasal spray; insert 1 spray into each nostril(s) twice a week  Dispense: 16 g; Refill: 5  5. Seasonal allergic rhinitis, unspecified trigger - fluticasone (FLONASE) 50  MCG/ACT nasal spray; insert 1 spray into each nostril(s) twice a week  Dispense: 16 g; Refill: 5  6. Trigger finger, right middle finger We will get him back to orthopedics - Ambulatory referral to Orthopedic Surgery  7. Trigger finger, left middle finger See #6 above - Ambulatory referral to Orthopedic Surgery  8. Gastroesophageal reflux disease without esophagitis See #4 above.    [employer] Language interpreter used during this encounter. #[id number], [place]  Patient was given the opportunity to ask questions.  Patient verbalized understanding of the plan and was able to repeat key elements of the plan.   This documentation was completed using Radio producer.  Any transcriptional errors are unintentional.  Orders Placed This Encounter  Procedures   Ambulatory referral to Orthopedic Surgery   POCT glycosylated hemoglobin (Hb A1C)     Requested Prescriptions   Signed Prescriptions Disp Refills   fluticasone (FLONASE) 50 MCG/ACT nasal spray 16 g 5    Sig: insert 1 spray into each nostril(s) twice a week   metFORMIN (GLUCOPHAGE) 500 MG tablet 90 tablet 1    Sig: Take 1 tablet (500 mg total) by mouth daily with breakfast.    Return in about 4 weeks (around 06/10/2021) for PAP.  Karle Plumber, MD, FACP

## 2021-05-20 ENCOUNTER — Other Ambulatory Visit: Payer: Self-pay

## 2021-05-20 ENCOUNTER — Ambulatory Visit (INDEPENDENT_AMBULATORY_CARE_PROVIDER_SITE_OTHER): Payer: Self-pay | Admitting: Orthopedic Surgery

## 2021-05-20 DIAGNOSIS — M65332 Trigger finger, left middle finger: Secondary | ICD-10-CM

## 2021-05-20 DIAGNOSIS — M65331 Trigger finger, right middle finger: Secondary | ICD-10-CM

## 2021-05-20 MED ORDER — LIDOCAINE HCL 1 % IJ SOLN
1.0000 mL | INTRAMUSCULAR | Status: AC | PRN
  Administered 2021-05-20: 1 mL

## 2021-05-20 MED ORDER — BETAMETHASONE SOD PHOS & ACET 6 (3-3) MG/ML IJ SUSP
6.0000 mg | INTRAMUSCULAR | Status: AC | PRN
  Administered 2021-05-20: 6 mg via INTRA_ARTICULAR

## 2021-05-20 NOTE — Progress Notes (Signed)
? ?Office Visit Note ?  ?Patient: Veronica Hart           ?Date of Birth: February 26, 1956           ?MRN: [id number] ?Visit Date: 05/20/2021 ?             ?Requested by: Ladell Pier, MD ?[address] ?[place],  [place] [id number] ?PCP: Ladell Pier, MD ? ? ?Assessment & Plan: ?Visit Diagnoses:  ?1. Trigger finger, right middle finger   ?2. Trigger finger, left middle finger   ? ? ?Plan: We again reviewed the nature of trigger finger and both conservative and surgical treatment options.  After our discussion, she would like to proceed with repeat corticosteroid injection.  She had approximately 3 months of symptom relief with the previous injections with her symptoms returning around 2 weeks ago.  She is not ready to discuss surgical treatment at this point.  I can see her back as needed if her symptoms return following this round of injections. ? ?Follow-Up Instructions: No follow-ups on file.  ? ?Orders:  ?No orders of the defined types were placed in this encounter. ? ?No orders of the defined types were placed in this encounter. ? ? ? ? Procedures: ?Hand/UE Inj: R long A1 for trigger finger on 05/20/2021 9:42 AM ?Indications: tendon swelling and therapeutic ?Details: 25 G needle, volar approach ?Medications: 1 mL lidocaine 1 %; 6 mg betamethasone acetate-betamethasone sodium phosphate 6 (3-3) MG/ML ?Procedure, treatment alternatives, risks and benefits explained, specific risks discussed. Consent was given by the patient. Immediately prior to procedure a time out was called to verify the correct patient, procedure, equipment, support staff and site/side marked as required. Patient was prepped and draped in the usual sterile fashion.  ? ? ?Hand/UE Inj: L long A1 for trigger finger on 05/20/2021 9:42 AM ?Indications: tendon swelling and therapeutic ?Details: 25 G needle, volar approach ?Medications: 1 mL lidocaine 1 %; 6 mg betamethasone acetate-betamethasone sodium phosphate 6 (3-3) MG/ML ?Procedure,  treatment alternatives, risks and benefits explained, specific risks discussed. Consent was given by the patient. Immediately prior to procedure a time out was called to verify the correct patient, procedure, equipment, support staff and site/side marked as required. Patient was prepped and draped in the usual sterile fashion.  ? ? ? ? ?Clinical Data: ?No additional findings. ? ? ?Subjective: ?Chief Complaint  ?Patient presents with  ? Left Middle Finger - Follow-up  ?  Injections helped for about 3 months  ? Right Middle Finger - Follow-up  ? ? ?Is a 66 year old right-hand-dominant female who works in [employer] and presents with triggering of her right and left middle fingers.  She was last seen on 01/15/2021 at which time she underwent corticosteroid junction of the the middle finger A1 pulleys.  The injection provided significant symptom relief for 3 months.  Her symptoms returned about 2 weeks ago.  She has difficulty with pulling and gripping things now secondary to pain at the A1 pulley.  She denies any new symptoms today. ? ? ?Review of Systems ? ? ?Objective: ?Vital Signs: There were no vitals taken for this visit. ? ?Physical Exam ? ?Right Hand Exam  ? ?Tenderness  ?Right hand tenderness location: TTP middle finger A1 pulley. ? ?Other  ?Erythema: absent ?Sensation: normal ?Pulse: present ? ?Comments:  TTP at middle finger A1 pulley with palpable nodule.  ? ? ?Left Hand Exam  ? ?Tenderness  ?Left hand tenderness location: TTP middle finger A1 pulley.  ? ?  Other  ?Erythema: absent ?Sensation: normal ?Pulse: present ? ?Comments:  TTP at middle finger A1 pulley with palpable nodule.  ? ? ? ? ?Specialty Comments:  ?No specialty comments available. ? ?Imaging: ?No results found. ? ? ?PMFS History: ?Patient Active Problem List  ? Diagnosis Date Noted  ? Trigger finger, right middle finger 01/15/2021  ? Trigger finger, left middle finger 01/15/2021  ? Upper airway cough syndrome 04/20/2018  ? Lateral  epicondylitis of right elbow 03/30/2018  ? Right elbow pain 03/23/2018  ? Benign paroxysmal positional vertigo due to bilateral vestibular disorder 09/15/2016  ? Elevated rheumatoid factor 05/08/2016  ? Arthritis of left hip 05/05/2016  ? Environmental allergies 05/05/2016  ? Allergic rhinitis 11/21/2013  ? Prediabetes 05/17/2013  ? Language barrier, speaks Spanish only 02/21/2013  ? Arthritis 02/14/2013  ? ?Past Medical History:  ?Diagnosis Date  ? Allergy   ? seasonal  ? Arthritis   ?  ?Family History  ?Problem Relation Age of Onset  ? Arthritis Mother   ? Colon cancer Neg Hx   ?  ?Past Surgical History:  ?Procedure Laterality Date  ? TUBAL LIGATION    ? ?Social History  ? ?Occupational History  ? Not on file  ?Tobacco Use  ? Smoking status: Never  ? Smokeless tobacco: Never  ?Substance and Sexual Activity  ? Alcohol use: No  ?  Alcohol/week: 0.0 standard drinks  ? Drug use: No  ? Sexual activity: Not Currently  ?  Birth control/protection: Surgical  ? ? ? ? ? ? ?

## 2021-06-10 ENCOUNTER — Other Ambulatory Visit: Payer: Self-pay

## 2021-06-11 ENCOUNTER — Other Ambulatory Visit (HOSPITAL_COMMUNITY)
Admission: RE | Admit: 2021-06-11 | Discharge: 2021-06-11 | Disposition: A | Discharge status: Home / Self Care | Payer: Self-pay | Source: Ambulatory Visit | Attending: Internal Medicine | Admitting: Internal Medicine

## 2021-06-11 ENCOUNTER — Ambulatory Visit: Payer: Self-pay | Attending: Internal Medicine | Admitting: Internal Medicine

## 2021-06-11 ENCOUNTER — Encounter: Payer: Self-pay | Admitting: Internal Medicine

## 2021-06-11 VITALS — BP 128/80 | HR 72 | Resp 16 | Wt 134.0 lb

## 2021-06-11 DIAGNOSIS — Z1231 Encounter for screening mammogram for malignant neoplasm of breast: Secondary | ICD-10-CM

## 2021-06-11 DIAGNOSIS — Z124 Encounter for screening for malignant neoplasm of cervix: Secondary | ICD-10-CM | POA: Insufficient documentation

## 2021-06-11 NOTE — Progress Notes (Signed)
? ? ?Patient ID: Veronica Hart, female    DOB: Aug 07, 1955  MRN: [id number] ? ?CC:  for PAP ? ?Subjective: ?Veronica Hart is a 66 y.o. female who presents for PAP ?Her concerns today include:  ?Patient with history of arthritis (saw a rheumatologist at [facility] 2021 and told she does not have RA), GERD, prediabetes, UACS/allergies, HL, HTN, preDM (dx 01/2021).   ? ?GYN History:  ?Pt is G2P2 ?Any hx of abn paps?: no ?Menses regular or irregular?:  NA. Postmenopausal ?How long does menses last? NA ?Menstrual flow light or heavy?: NA ?Method of birth control?:  NA ?Any vaginal dischg at this time?: no ?Dysuria?: no ?Any hx of STI?:  no ?Sexually active with how many partners: 1 ?Desires STI screen: no ?Last MMG: 06/14/2019 ?Family hx of uterine, cervical or breast cancer?:   ? ? ?Patient Active Problem List  ? Diagnosis Date Noted  ? Trigger finger, right middle finger 01/15/2021  ? Trigger finger, left middle finger 01/15/2021  ? Upper airway cough syndrome 04/20/2018  ? Lateral epicondylitis of right elbow 03/30/2018  ? Right elbow pain 03/23/2018  ? Benign paroxysmal positional vertigo due to bilateral vestibular disorder 09/15/2016  ? Elevated rheumatoid factor 05/08/2016  ? Arthritis of left hip 05/05/2016  ? Environmental allergies 05/05/2016  ? Allergic rhinitis 11/21/2013  ? Prediabetes 05/17/2013  ? Language barrier, speaks Spanish only 02/21/2013  ? Arthritis 02/14/2013  ?  ? ?Current Outpatient Medications on File Prior to Visit  ?Medication Sig Dispense Refill  ? amLODipine (NORVASC) 5 MG tablet Take 1 tablet (5 mg total) by mouth daily. 30 tablet 4  ? benzonatate (TESSALON) 100 MG capsule Take 1 capsule (100 mg total) by mouth 2 (two) times daily as needed for cough. 20 capsule 0  ? diclofenac (VOLTAREN) 75 MG EC tablet Take 1 tablet (75 mg total) by mouth 2 (two) times daily. 60 tablet 0  ? fluticasone (FLONASE) 50 MCG/ACT nasal spray insert 1 spray into each nostril(s) twice a week 16 g 5  ?  meclizine (ANTIVERT) 12.5 MG tablet Take 1 tablet (12.5 mg total) by mouth daily as needed for dizziness. 30 tablet 0  ? metFORMIN (GLUCOPHAGE) 500 MG tablet Take 1 tablet (500 mg total) by mouth daily with breakfast. 90 tablet 1  ? omeprazole (PRILOSEC) 20 MG capsule Take 1 capsule (20 mg total) by mouth daily. 30 capsule 6  ? ?No current facility-administered medications on file prior to visit.  ? ? ?Allergies  ?Allergen Reactions  ? Hydroxychloroquine   ?  Causes diarrhea, blurred vision and weakness.  ? ? ?Social History  ? ?Socioeconomic History  ? Marital status: Married  ?  Spouse name: Not on file  ? Number of children: Not on file  ? Years of education: Not on file  ? Highest education level: Not on file  ?Occupational History  ? Not on file  ?Tobacco Use  ? Smoking status: Never  ? Smokeless tobacco: Never  ?Substance and Sexual Activity  ? Alcohol use: No  ?  Alcohol/week: 0.0 standard drinks  ? Drug use: No  ? Sexual activity: Not Currently  ?  Birth control/protection: Surgical  ?Other Topics Concern  ? Not on file  ?Social History Narrative  ? Not on file  ? ?Social Determinants of Health  ? ?Financial Resource Strain: Not on file  ?Food Insecurity: Not on file  ?Transportation Needs: Not on file  ?Physical Activity: Not on file  ?Stress: Not on file  ?  Social Connections: Not on file  ?Intimate Partner Violence: Not on file  ? ? ?Family History  ?Problem Relation Age of Onset  ? Arthritis Mother   ? Colon cancer Neg Hx   ? ? ?Past Surgical History:  ?Procedure Laterality Date  ? TUBAL LIGATION    ? ? ?ROS: ?Review of Systems ?Negative except as stated above ? ?PHYSICAL EXAM: ?BP 128/80   Pulse 72   Resp 16   Wt 134 lb (60.8 kg)   SpO2 95%   BMI 27.06 kg/m?   ?Physical Exam ? ?General appearance - alert, well appearing, and in no distress ?Mental status - normal mood, behavior, speech, dress, motor activity, and thought processes ?Pelvic - Boykin Reaper CMA present:  no external vaginal lesions. pt  with atrophy of vaginal walls and cervical oz stenosed.  Exam was painful for pt due to significant atrophy.  No adnexal mass or abnormal enlargement of uterus.   ? ? ? ?  Latest Ref Rng & Units 01/09/2021  ?  9:33 AM 06/27/2020  ?  9:23 AM 05/16/2019  ? 10:13 AM  ?CMP  ?Glucose 70 - 99 mg/dL 100   100   109    ?BUN 8 - 27 mg/dL '12   18   16    '$ ?Creatinine 0.57 - 1.00 mg/dL 0.75   0.80   0.84    ?Sodium 134 - 144 mmol/L 141   142   141    ?Potassium 3.5 - 5.2 mmol/L 4.4   4.4   4.2    ?Chloride 96 - 106 mmol/L 105   105   104    ?CO2 20 - 29 mmol/L '24   23   22    '$ ?Calcium 8.7 - 10.3 mg/dL 9.3   9.4   9.3    ?Total Protein 6.0 - 8.5 g/dL 7.4   7.4   7.5    ?Total Bilirubin 0.0 - 1.2 mg/dL 0.3   0.2   0.3    ?Alkaline Phos 44 - 121 IU/L 115   111   112    ?AST 0 - 40 IU/L '22   20   19    '$ ?ALT 0 - 32 IU/L '21   28   19    '$ ? ?Lipid Panel  ?   ?Component Value Date/Time  ? CHOL 176 01/09/2021 0933  ? TRIG 94 01/09/2021 0933  ? HDL 58 01/09/2021 0933  ? CHOLHDL 3.0 01/09/2021 0933  ? CHOLHDL 3.5 11/21/2013 1749  ? VLDL 24 11/21/2013 1749  ? LDLCALC 101 (H) 01/09/2021 0933  ? ? ?CBC ?   ?Component Value Date/Time  ? WBC 6.5 06/27/2020 0923  ? WBC 5.7 05/05/2016 1007  ? RBC 4.48 06/27/2020 0923  ? RBC 4.56 05/05/2016 1007  ? HGB 13.5 06/27/2020 0923  ? HCT 40.8 06/27/2020 0923  ? PLT 321 06/27/2020 0923  ? MCV 91 06/27/2020 0923  ? MCH 30.1 06/27/2020 0923  ? MCH 29.8 05/05/2016 1007  ? MCHC 33.1 06/27/2020 0923  ? MCHC 33.3 05/05/2016 1007  ? RDW 12.5 06/27/2020 0923  ? LYMPHSABS 2.1 05/12/2017 1100  ? MONOABS 0.4 01/22/2010 2119  ? EOSABS 0.8 (H) 05/12/2017 1100  ? BASOSABS 0.1 05/12/2017 1100  ? ? ?ASSESSMENT AND PLAN: ? ?1. Pap smear for cervical cancer screening ?- Cytology - PAP ? ?2. Encounter for screening mammogram for malignant neoplasm of breast ?CMA to give her MMG scholarship ?- MM Digital Screening; Future ? ?AMN Language  interpreter used during this encounter. #[id number], Natale Lay ? ?Patient was given the opportunity  to ask questions.  Patient verbalized understanding of the plan and was able to repeat key elements of the plan.  ? ?This documentation was completed using Radio producer.  Any transcriptional errors are unintentional. ? ?Orders Placed This Encounter  ?Procedures  ? MM Digital Screening  ? ? ? ?Requested Prescriptions  ? ? No prescriptions requested or ordered in this encounter  ? ? ?No follow-ups on file. ? ?Karle Plumber, MD, FACP ?

## 2021-06-12 LAB — CYTOLOGY - PAP
Comment: NEGATIVE
Diagnosis: NEGATIVE
High risk HPV: NEGATIVE

## 2021-06-17 ENCOUNTER — Telehealth: Payer: Self-pay

## 2021-06-17 NOTE — Telephone Encounter (Signed)
Contacted pt to go over lab results pt is aware and doesn't have any questions or concerns 

## 2021-06-24 ENCOUNTER — Telehealth: Payer: Self-pay | Admitting: Internal Medicine

## 2021-06-24 NOTE — Telephone Encounter (Signed)
Pt is calling to schedule appt for her CAFA application that expires this month. ?Please advise CB- [phone] ?

## 2021-06-24 NOTE — Telephone Encounter (Signed)
Patient informed - ?We will still have application packets at the front desk. Advised he would mail the packet in for approval instead of having an appointment with Clifton James. We have both the [facility] and [facility] card applications along with the information on where to mail the paperwork.  ?  ?Patient verbalized understanding. Stated he will come to office to pick up packet. ? ?Patient states she has already came to the office to pick up the application.  Advised to mail packet back and wait for approval.  ? ?Interpreter assistance provided by [employer] [phone] ?

## 2021-07-09 ENCOUNTER — Other Ambulatory Visit: Payer: Self-pay | Admitting: Physician Assistant

## 2021-07-09 ENCOUNTER — Other Ambulatory Visit: Payer: Self-pay

## 2021-07-09 DIAGNOSIS — M24849 Other specific joint derangements of unspecified hand, not elsewhere classified: Secondary | ICD-10-CM

## 2021-07-09 DIAGNOSIS — M1990 Unspecified osteoarthritis, unspecified site: Secondary | ICD-10-CM

## 2021-07-09 NOTE — Telephone Encounter (Signed)
Requested medication (s) are due for refill today: yes ? ?Requested medication (s) are on the active medication list: yes ? ?Last refill:  01/09/21 #60/0 ? ?Future visit scheduled: no ? ?Notes to clinic:  Unable to refill per protocol due to failed labs, no updated results. ? ? ?  ?Requested Prescriptions  ?Pending Prescriptions Disp Refills  ? diclofenac (VOLTAREN) 75 MG EC tablet 60 tablet 0  ?  Sig: Take 1 tablet (75 mg total) by mouth 2 (two) times daily.  ?  ? Analgesics:  NSAIDS Failed - 07/09/2021 10:38 AM  ?  ?  Failed - Manual Review: Labs are only required if the patient has taken medication for more than 8 weeks.  ?  ?  Failed - HGB in normal range and within 360 days  ?  Hemoglobin  ?Date Value Ref Range Status  ?06/27/2020 13.5 11.1 - 15.9 g/dL Final  ?  ?  ?  ?  Failed - PLT in normal range and within 360 days  ?  Platelets  ?Date Value Ref Range Status  ?06/27/2020 321 150 - 450 x10E3/uL Final  ?  ?  ?  ?  Failed - HCT in normal range and within 360 days  ?  Hematocrit  ?Date Value Ref Range Status  ?06/27/2020 40.8 34.0 - 46.6 % Final  ?  ?  ?  ?  Passed - Cr in normal range and within 360 days  ?  Creat  ?Date Value Ref Range Status  ?01/17/2013 0.67 0.50 - 1.10 mg/dL Final  ? ?Creatinine, Ser  ?Date Value Ref Range Status  ?01/09/2021 0.75 0.57 - 1.00 mg/dL Final  ?  ?  ?  ?  Passed - eGFR is 30 or above and within 360 days  ?  GFR, Est African American  ?Date Value Ref Range Status  ?01/17/2013 >89 mL/min Final  ? ?GFR calc Af Amer  ?Date Value Ref Range Status  ?05/16/2019 86 >59 mL/min/1.73 Final  ? ?GFR, Est Non African American  ?Date Value Ref Range Status  ?01/17/2013 >89 mL/min Final  ?  Comment:  ?    ?The estimated GFR is a calculation valid for adults (>=103 years old) ?that uses the CKD-EPI algorithm to adjust for age and sex. It is   ?not to be used for children, pregnant women, hospitalized patients,    ?patients on dialysis, or with rapidly changing kidney function. ?According to the  NKDEP, eGFR >89 is normal, 60-89 shows mild ?impairment, 30-59 shows moderate impairment, 15-29 shows severe ?impairment and <15 is ESRD. ?   ? ?GFR calc non Af Amer  ?Date Value Ref Range Status  ?05/16/2019 75 >59 mL/min/1.73 Final  ? ?eGFR  ?Date Value Ref Range Status  ?01/09/2021 89 >59 mL/min/1.73 Final  ?  ?  ?  ?  Passed - Patient is not pregnant  ?  ?  Passed - Valid encounter within last 12 months  ?  Recent Outpatient Visits   ? ?      ? 4 weeks ago Pap smear for cervical cancer screening  ? [place] Ladell Pier, MD  ? 1 month ago Essential hypertension  ? [place] Ladell Pier, MD  ? 5 months ago Essential hypertension  ? [place], RPH-CPP  ? 6 months ago Essential hypertension  ? [place], [place], [place]  ?  1 year ago Viral respiratory illness  ? [place] Ladell Pier, MD  ? ?  ?  ? ? ?  ?  ?  ? ?

## 2021-07-11 ENCOUNTER — Other Ambulatory Visit: Payer: Self-pay

## 2021-07-11 MED ORDER — DICLOFENAC SODIUM 75 MG PO TBEC
75.0000 mg | DELAYED_RELEASE_TABLET | Freq: Two times a day (BID) | ORAL | 1 refills | Status: DC | PRN
  Filled 2021-07-11: qty 60, 30d supply, fill #0

## 2021-07-11 NOTE — Telephone Encounter (Signed)
Will forward to provider  

## 2021-07-12 ENCOUNTER — Other Ambulatory Visit: Payer: Self-pay

## 2021-07-16 ENCOUNTER — Other Ambulatory Visit: Payer: Self-pay

## 2021-08-06 ENCOUNTER — Other Ambulatory Visit: Payer: Self-pay

## 2021-08-13 ENCOUNTER — Ambulatory Visit
Admission: RE | Admit: 2021-08-13 | Discharge: 2021-08-13 | Disposition: A | Discharge status: Home / Self Care | Payer: No Typology Code available for payment source | Source: Ambulatory Visit | Attending: Internal Medicine | Admitting: Internal Medicine

## 2021-08-13 DIAGNOSIS — Z1231 Encounter for screening mammogram for malignant neoplasm of breast: Secondary | ICD-10-CM

## 2021-08-19 ENCOUNTER — Telehealth: Payer: Self-pay | Admitting: Internal Medicine

## 2021-08-19 NOTE — Telephone Encounter (Signed)
Pt is calling because she sent in her CAFA paperwork over a month ago. Pt has not heard a response. [phone]

## 2021-08-20 NOTE — Telephone Encounter (Signed)
Please follow up with patient.

## 2021-08-26 ENCOUNTER — Other Ambulatory Visit: Payer: Self-pay

## 2021-09-12 ENCOUNTER — Telehealth: Payer: Self-pay | Admitting: Emergency Medicine

## 2021-09-12 NOTE — Telephone Encounter (Signed)
Orange card

## 2021-09-16 ENCOUNTER — Other Ambulatory Visit: Payer: Self-pay | Admitting: Physician Assistant

## 2021-09-16 ENCOUNTER — Other Ambulatory Visit: Payer: Self-pay

## 2021-09-16 DIAGNOSIS — I10 Essential (primary) hypertension: Secondary | ICD-10-CM

## 2021-09-16 MED ORDER — AMLODIPINE BESYLATE 5 MG PO TABS
5.0000 mg | ORAL_TABLET | Freq: Every day | ORAL | 4 refills | Status: DC
  Filled 2021-09-16: qty 30, 30d supply, fill #0
  Filled 2021-11-05: qty 30, 30d supply, fill #1
  Filled 2021-12-23: qty 30, 30d supply, fill #2
  Filled 2022-01-21 (×2): qty 30, 30d supply, fill #3
  Filled 2022-02-26: qty 30, 30d supply, fill #4

## 2021-09-17 ENCOUNTER — Other Ambulatory Visit: Payer: Self-pay

## 2021-11-05 ENCOUNTER — Other Ambulatory Visit: Payer: Self-pay | Admitting: Internal Medicine

## 2021-11-05 ENCOUNTER — Other Ambulatory Visit: Payer: Self-pay

## 2021-11-05 DIAGNOSIS — R7303 Prediabetes: Secondary | ICD-10-CM

## 2021-11-05 MED ORDER — METFORMIN HCL 500 MG PO TABS
500.0000 mg | ORAL_TABLET | Freq: Every day | ORAL | 0 refills | Status: DC
  Filled 2021-11-05: qty 30, 30d supply, fill #0

## 2021-11-05 NOTE — Telephone Encounter (Signed)
Requested medication (s) are due for refill today: yes  Requested medication (s) are on the active medication list: yes  Last refill:  05/13/21 #90 with 0 RF  Future visit scheduled: no, seen 06/11/21  Notes to clinic:  Failed protocol of labs within 12 months, does not have upcoming appt, please assess.       Requested Prescriptions  Pending Prescriptions Disp Refills   metFORMIN (GLUCOPHAGE) 500 MG tablet 90 tablet 1    Sig: Take 1 tablet (500 mg total) by mouth daily with breakfast.     Endocrinology:  Diabetes - Biguanides Failed - 11/05/2021 10:04 AM      Failed - B12 Level in normal range and within 720 days    No results found for: "VITAMINB12"       Failed - CBC within normal limits and completed in the last 12 months    WBC  Date Value Ref Range Status  06/27/2020 6.5 3.4 - 10.8 x10E3/uL Final  05/05/2016 5.7 3.8 - 10.8 K/uL Final   RBC  Date Value Ref Range Status  06/27/2020 4.48 3.77 - 5.28 x10E6/uL Final  05/05/2016 4.56 3.80 - 5.10 MIL/uL Final   Hemoglobin  Date Value Ref Range Status  06/27/2020 13.5 11.1 - 15.9 g/dL Final   Hematocrit  Date Value Ref Range Status  06/27/2020 40.8 34.0 - 46.6 % Final   MCHC  Date Value Ref Range Status  06/27/2020 33.1 31.5 - 35.7 g/dL Final  05/05/2016 33.3 32.0 - 36.0 g/dL Final   [facility]  Date Value Ref Range Status  06/27/2020 30.1 26.6 - 33.0 pg Final  05/05/2016 29.8 27.0 - 33.0 pg Final   MCV  Date Value Ref Range Status  06/27/2020 91 79 - 97 fL Final   No results found for: "PLTCOUNTKUC", "LABPLAT", "POCPLA" RDW  Date Value Ref Range Status  06/27/2020 12.5 11.7 - 15.4 % Final         Passed - Cr in normal range and within 360 days    Creat  Date Value Ref Range Status  01/17/2013 0.67 0.50 - 1.10 mg/dL Final   Creatinine, Ser  Date Value Ref Range Status  01/09/2021 0.75 0.57 - 1.00 mg/dL Final         Passed - HBA1C is between 0 and 7.9 and within 180 days    Hemoglobin A1C  Date Value Ref  Range Status  05/13/2021 5.7 (A) 4.0 - 5.6 % Final   Hgb A1c MFr Bld  Date Value Ref Range Status  01/09/2021 6.0 (H) 4.8 - 5.6 % Final    Comment:             Prediabetes: 5.7 - 6.4          Diabetes: >6.4          Glycemic control for adults with diabetes: <7.0          Passed - eGFR in normal range and within 360 days    GFR, Est African American  Date Value Ref Range Status  01/17/2013 >89 mL/min Final   GFR calc Af Amer  Date Value Ref Range Status  05/16/2019 86 >59 mL/min/1.73 Final   GFR, Est Non African American  Date Value Ref Range Status  01/17/2013 >89 mL/min Final    Comment:      The estimated GFR is a calculation valid for adults (>=73 years old) that uses the CKD-EPI algorithm to adjust for age and sex. It is   not to  be used for children, pregnant women, hospitalized patients,    patients on dialysis, or with rapidly changing kidney function. According to the NKDEP, eGFR >89 is normal, 60-89 shows mild impairment, 30-59 shows moderate impairment, 15-29 shows severe impairment and <15 is ESRD.     GFR calc non Af Amer  Date Value Ref Range Status  05/16/2019 75 >59 mL/min/1.73 Final   eGFR  Date Value Ref Range Status  01/09/2021 89 >59 mL/min/1.73 Final         Passed - Valid encounter within last 6 months    Recent Outpatient Visits           4 months ago Pap smear for cervical cancer screening   [place], [place]   5 months ago Essential hypertension   [place], [place]   9 months ago Essential hypertension   [place], Stephen L, RPH-CPP   10 months ago Essential hypertension   [place], [place]   1 year ago Viral respiratory illness   [place] Ladell Pier, MD

## 2021-11-06 ENCOUNTER — Other Ambulatory Visit: Payer: Self-pay

## 2021-11-07 ENCOUNTER — Other Ambulatory Visit: Payer: Self-pay

## 2021-12-23 ENCOUNTER — Other Ambulatory Visit: Payer: Self-pay

## 2021-12-23 ENCOUNTER — Other Ambulatory Visit: Payer: Self-pay | Admitting: Internal Medicine

## 2021-12-23 DIAGNOSIS — R7303 Prediabetes: Secondary | ICD-10-CM

## 2021-12-24 ENCOUNTER — Other Ambulatory Visit: Payer: Self-pay

## 2021-12-30 ENCOUNTER — Other Ambulatory Visit: Payer: Self-pay | Admitting: Internal Medicine

## 2021-12-30 ENCOUNTER — Other Ambulatory Visit: Payer: Self-pay

## 2021-12-30 DIAGNOSIS — R7303 Prediabetes: Secondary | ICD-10-CM

## 2021-12-31 ENCOUNTER — Other Ambulatory Visit: Payer: Self-pay

## 2022-01-21 ENCOUNTER — Other Ambulatory Visit: Payer: Self-pay | Admitting: Physician Assistant

## 2022-01-21 ENCOUNTER — Other Ambulatory Visit: Payer: Self-pay | Admitting: Internal Medicine

## 2022-01-21 ENCOUNTER — Other Ambulatory Visit: Payer: Self-pay

## 2022-01-21 DIAGNOSIS — K219 Gastro-esophageal reflux disease without esophagitis: Secondary | ICD-10-CM

## 2022-01-21 DIAGNOSIS — R7303 Prediabetes: Secondary | ICD-10-CM

## 2022-01-24 ENCOUNTER — Other Ambulatory Visit: Payer: Self-pay

## 2022-02-26 ENCOUNTER — Other Ambulatory Visit: Payer: Self-pay

## 2022-05-29 ENCOUNTER — Telehealth: Payer: Self-pay | Admitting: Emergency Medicine

## 2022-05-29 NOTE — Telephone Encounter (Signed)
Copied from [place]. Topic: General - Inquiry >> May 29, 2022  3:14 PM Marcellus Scott wrote: Reason for CRM: Pt is calling to f/u on her application for the orange card. She stated that she has called requesting an update and has not received a call back.  Please advise.

## 2022-06-02 ENCOUNTER — Ambulatory Visit: Payer: Self-pay | Admitting: Internal Medicine

## 2022-06-25 ENCOUNTER — Other Ambulatory Visit: Payer: Self-pay

## 2022-06-25 ENCOUNTER — Ambulatory Visit: Payer: Self-pay | Attending: Internal Medicine | Admitting: Physician Assistant

## 2022-06-25 ENCOUNTER — Encounter: Payer: Self-pay | Admitting: Physician Assistant

## 2022-06-25 VITALS — BP 150/82 | HR 71 | Wt 139.6 lb

## 2022-06-25 DIAGNOSIS — I10 Essential (primary) hypertension: Secondary | ICD-10-CM

## 2022-06-25 DIAGNOSIS — Z603 Acculturation difficulty: Secondary | ICD-10-CM

## 2022-06-25 DIAGNOSIS — Z758 Other problems related to medical facilities and other health care: Secondary | ICD-10-CM

## 2022-06-25 DIAGNOSIS — R7303 Prediabetes: Secondary | ICD-10-CM

## 2022-06-25 DIAGNOSIS — Z91199 Patient's noncompliance with other medical treatment and regimen due to unspecified reason: Secondary | ICD-10-CM

## 2022-06-25 MED ORDER — METFORMIN HCL 500 MG PO TABS
500.0000 mg | ORAL_TABLET | Freq: Every day | ORAL | 0 refills | Status: DC
  Filled 2022-06-25: qty 90, 90d supply, fill #0

## 2022-06-25 MED ORDER — AMLODIPINE BESYLATE 5 MG PO TABS
5.0000 mg | ORAL_TABLET | Freq: Every day | ORAL | 0 refills | Status: DC
  Filled 2022-06-25: qty 90, 90d supply, fill #0

## 2022-06-25 NOTE — Progress Notes (Signed)
Patient ID: Veronica Hart, female   DOB: 06-13-1955, 67 y.o.   MRN: [id number]   Veronica Hart, is a 67 y.o. female  [id number]  [id number]  DOB - November 30, 1955  Chief Complaint  Patient presents with   Medication Refill       Subjective:   Veronica Hart is a 67 y.o. female here today for med RF "If I still need to be taking the medications."  She has not been taking metformin or amlodipine for at least several months but says "I feel fine."  No new concerns.  Denies dizziness/HA/CP/SOB.  Does not check Bp or glucose out of the office  No problems updated.  ALLERGIES: Allergies  Allergen Reactions   Hydroxychloroquine     Causes diarrhea, blurred vision and weakness.    PAST MEDICAL HISTORY: Past Medical History:  Diagnosis Date   Allergy    seasonal   Arthritis     MEDICATIONS AT HOME: Prior to Admission medications   Medication Sig Start Date End Date Taking? Authorizing Provider  benzonatate (TESSALON) 100 MG capsule Take 1 capsule (100 mg total) by mouth 2 (two) times daily as needed for cough. 06/19/20  Yes Marcine Matar, MD  diclofenac (VOLTAREN) 75 MG EC tablet Take 1 tablet (75 mg total) by mouth 2 (two) times daily as needed. 07/11/21  Yes Marcine Matar, MD  fluticasone Aleda Grana) 50 MCG/ACT nasal spray insert 1 spray into each nostril(s) twice a week 05/13/21  Yes Marcine Matar, MD  meclizine (ANTIVERT) 12.5 MG tablet Take 1 tablet (12.5 mg total) by mouth daily as needed for dizziness. 05/10/19  Yes Marcine Matar, MD  amLODipine (NORVASC) 5 MG tablet Take 1 tablet (5 mg total) by mouth daily. 06/25/22   Anders Simmonds, PA-C  metFORMIN (GLUCOPHAGE) 500 MG tablet Take 1 tablet (500 mg total) by mouth daily with breakfast. 06/25/22   Anders Simmonds, PA-C  omeprazole (PRILOSEC) 20 MG capsule Take 1 capsule (20 mg total) by mouth daily. Patient not taking: Reported on 06/25/2022 01/09/21   Anders Simmonds, PA-C    ROS: Neg  HEENT Neg resp Neg cardiac Neg GI Neg GU Neg MS Neg psych Neg neuro  Objective:   Vitals:   06/25/22 1044  BP: (!) 141/82  Pulse: 71  SpO2: 97%  Weight: 139 lb 9.6 oz (63.3 kg)   Exam General appearance : Awake, alert, not in any distress. Speech Clear. Not toxic looking HEENT: Atraumatic and Normocephalic Neck: Supple, no JVD. No cervical lymphadenopathy.  Chest: Good air entry bilaterally, CTAB.  No rales/rhonchi/wheezing CVS: S1 S2 regular, no murmurs.  Extremities: B/L Lower Ext shows no edema, both legs are warm to touch Neurology: Awake alert, and oriented X 3, CN II-XII intact, Non focal Skin: No Rash  Data Review Lab Results  Component Value Date   HGBA1C 5.7 (A) 05/13/2021   HGBA1C 6.0 (H) 01/09/2021   HGBA1C 5.9 (H) 05/16/2019    Assessment & Plan   1. Essential hypertension Second BP higher than first-resume aamlodipine - Comprehensive metabolic panel - amLODipine (NORVASC) 5 MG tablet; Take 1 tablet (5 mg total) by mouth daily.  Dispense: 90 tablet; Refill: 0  2. Prediabetes - Hemoglobin A1c - Comprehensive metabolic panel - metFORMIN (GLUCOPHAGE) 500 MG tablet; Take 1 tablet (500 mg total) by mouth daily with breakfast.  Dispense: 90 tablet; Refill: 0 - Lipid panel  3. Language barrier, speaks Spanish only AMN "[place]" interpreters used and additional time performing visit was  required.  4. Noncompliance Take meds as directed    Return in about 4 months (around 10/25/2022) for PCP for chronic conditions.  The patient was given clear instructions to go to ER or return to medical center if symptoms don't improve, worsen or new problems develop. The patient verbalized understanding. The patient was told to call to get lab results if they haven't heard anything in the next week.      Georgian Co, PA-C [facility] and [facility] [place], [place] [phone]   06/25/2022, 11:03 AM

## 2022-06-26 LAB — HEMOGLOBIN A1C
Est. average glucose Bld gHb Est-mCnc: 126 mg/dL
Hgb A1c MFr Bld: 6 % — ABNORMAL HIGH (ref 4.8–5.6)

## 2022-06-26 LAB — COMPREHENSIVE METABOLIC PANEL
ALT: 21 IU/L (ref 0–32)
AST: 23 IU/L (ref 0–40)
Albumin/Globulin Ratio: 1.3 (ref 1.2–2.2)
Albumin: 4.4 g/dL (ref 3.9–4.9)
Alkaline Phosphatase: 123 IU/L — ABNORMAL HIGH (ref 44–121)
BUN/Creatinine Ratio: 21 (ref 12–28)
BUN: 16 mg/dL (ref 8–27)
Bilirubin Total: 0.3 mg/dL (ref 0.0–1.2)
CO2: 22 mmol/L (ref 20–29)
Calcium: 9.5 mg/dL (ref 8.7–10.3)
Chloride: 104 mmol/L (ref 96–106)
Creatinine, Ser: 0.76 mg/dL (ref 0.57–1.00)
Globulin, Total: 3.4 g/dL (ref 1.5–4.5)
Glucose: 89 mg/dL (ref 70–99)
Potassium: 4.4 mmol/L (ref 3.5–5.2)
Sodium: 139 mmol/L (ref 134–144)
Total Protein: 7.8 g/dL (ref 6.0–8.5)
eGFR: 87 mL/min/{1.73_m2} (ref >59)

## 2022-06-26 LAB — LIPID PANEL
Chol/HDL Ratio: 3.3 ratio (ref 0.0–4.4)
Cholesterol, Total: 179 mg/dL (ref 100–199)
HDL: 54 mg/dL (ref >39)
LDL Chol Calc (NIH): 107 mg/dL — ABNORMAL HIGH (ref 0–99)
Triglycerides: 98 mg/dL (ref 0–149)
VLDL Cholesterol Cal: 18 mg/dL (ref 5–40)

## 2022-07-01 ENCOUNTER — Telehealth: Payer: Self-pay | Admitting: *Deleted

## 2022-07-01 NOTE — Telephone Encounter (Signed)
Pt given lab results  per interpreter Delia ID # [phone] per notes of A. Sharon Seller, PA from 06/26/22  on 07/01/22. Pt verbalized understanding. Patient aware prediabetes has increased and to continue metformin and eliminate sugar and starchy foods from diet to prevent progression to diabetes.

## 2022-07-01 NOTE — Telephone Encounter (Signed)
noted 

## 2022-07-03 ENCOUNTER — Ambulatory Visit: Payer: Self-pay | Admitting: Physician Assistant

## 2022-07-08 ENCOUNTER — Other Ambulatory Visit: Payer: Self-pay

## 2022-09-01 ENCOUNTER — Ambulatory Visit: Payer: Self-pay | Attending: Internal Medicine

## 2022-09-22 ENCOUNTER — Other Ambulatory Visit: Payer: Self-pay

## 2022-09-29 ENCOUNTER — Other Ambulatory Visit: Payer: Self-pay

## 2022-12-17 ENCOUNTER — Other Ambulatory Visit: Payer: Self-pay

## 2022-12-17 ENCOUNTER — Ambulatory Visit: Payer: Self-pay | Attending: Physician Assistant | Admitting: Physician Assistant

## 2022-12-17 ENCOUNTER — Encounter: Payer: Self-pay | Admitting: Physician Assistant

## 2022-12-17 VITALS — BP 143/84 | HR 72 | Wt 141.0 lb

## 2022-12-17 DIAGNOSIS — R140 Abdominal distension (gaseous): Secondary | ICD-10-CM

## 2022-12-17 DIAGNOSIS — Z603 Acculturation difficulty: Secondary | ICD-10-CM

## 2022-12-17 DIAGNOSIS — M1990 Unspecified osteoarthritis, unspecified site: Secondary | ICD-10-CM

## 2022-12-17 DIAGNOSIS — J302 Other seasonal allergic rhinitis: Secondary | ICD-10-CM

## 2022-12-17 DIAGNOSIS — R42 Dizziness and giddiness: Secondary | ICD-10-CM

## 2022-12-17 DIAGNOSIS — Z758 Other problems related to medical facilities and other health care: Secondary | ICD-10-CM

## 2022-12-17 DIAGNOSIS — Z23 Encounter for immunization: Secondary | ICD-10-CM

## 2022-12-17 DIAGNOSIS — R7303 Prediabetes: Secondary | ICD-10-CM

## 2022-12-17 DIAGNOSIS — I10 Essential (primary) hypertension: Secondary | ICD-10-CM

## 2022-12-17 DIAGNOSIS — K219 Gastro-esophageal reflux disease without esophagitis: Secondary | ICD-10-CM

## 2022-12-17 DIAGNOSIS — H6993 Unspecified Eustachian tube disorder, bilateral: Secondary | ICD-10-CM

## 2022-12-17 MED ORDER — DICLOFENAC SODIUM 75 MG PO TBEC
75.0000 mg | DELAYED_RELEASE_TABLET | Freq: Two times a day (BID) | ORAL | 1 refills | Status: AC | PRN
  Filled 2022-12-17: qty 60, 30d supply, fill #0

## 2022-12-17 MED ORDER — AMLODIPINE BESYLATE 5 MG PO TABS
5.0000 mg | ORAL_TABLET | Freq: Every day | ORAL | 0 refills | Status: DC
  Filled 2022-12-17: qty 90, 90d supply, fill #0

## 2022-12-17 MED ORDER — FLUTICASONE PROPIONATE 50 MCG/ACT NA SUSP
NASAL | 5 refills | Status: DC
  Filled 2022-12-17: qty 16, fill #0

## 2022-12-17 MED ORDER — FLUTICASONE PROPIONATE 50 MCG/ACT NA SUSP
NASAL | 5 refills | Status: DC
  Filled 2022-12-17: qty 16, 30d supply, fill #0
  Filled 2023-06-29: qty 16, 30d supply, fill #1
  Filled 2023-11-02 (×2): qty 16, 30d supply, fill #2

## 2022-12-17 MED ORDER — OMEPRAZOLE 20 MG PO CPDR
20.0000 mg | DELAYED_RELEASE_CAPSULE | Freq: Every day | ORAL | 6 refills | Status: AC
  Filled 2022-12-17: qty 30, 30d supply, fill #0
  Filled 2023-03-09: qty 30, 30d supply, fill #1
  Filled 2023-05-04: qty 30, 30d supply, fill #2
  Filled 2023-08-10: qty 30, 30d supply, fill #3

## 2022-12-17 NOTE — Progress Notes (Signed)
Patient ID: Veronica Hart, female   DOB: 02/22/1956, 67 y.o.   MRN: [id number]   Veronica Hart, is a 67 y.o. female  [id number]  [id number]  DOB - 09/23/1955  Chief Complaint  Patient presents with   Medical Management of Chronic Issues    Neck and leg pain        Subjective:   Veronica Hart is a 67 y.o. female here today for med RF.  Did not take BP med today or yesterday.  Home readings ~120s/70-80.    C/o 1 year dizziness.  Also sinus congestion intermittently.  Does not drink hardly any water.  No CP/SOB/DOE/HA.    She also has neck and leg pain intermittently.  No paresthesias or weakness.  No loss of grip or strength in UE or LE.  This has been going on for a long time.  Voltaren has helped.  Not taking metformin bc it caused bloating/diarrhea/ and back pain.      ALLERGIES: Allergies  Allergen Reactions   Hydroxychloroquine     Causes diarrhea, blurred vision and weakness.    PAST MEDICAL HISTORY: Past Medical History:  Diagnosis Date   Allergy    seasonal   Arthritis     MEDICATIONS AT HOME: Prior to Admission medications   Medication Sig Start Date End Date Taking? Authorizing Provider  amLODipine (NORVASC) 5 MG tablet Take 1 tablet (5 mg total) by mouth daily. 12/17/22   Anders Simmonds, PA-C  diclofenac (VOLTAREN) 75 MG EC tablet Take 1 tablet (75 mg total) by mouth 2 (two) times daily as needed. 12/17/22   Anders Simmonds, PA-C  fluticasone (FLONASE) 50 MCG/ACT nasal spray insert 1 spray into each nostril(s) twice a week 12/17/22   Anders Simmonds, PA-C  meclizine (ANTIVERT) 12.5 MG tablet Take 1 tablet (12.5 mg total) by mouth daily as needed for dizziness. Patient not taking: Reported on 12/17/2022 05/10/19   Marcine Matar, MD  omeprazole (PRILOSEC) 20 MG capsule Take 1 capsule (20 mg total) by mouth daily. 12/17/22   Izabella Marcantel, Marzella Schlein, PA-C    ROS: Neg HEENT Neg resp Neg cardiac Neg GI Neg GU Neg psych Neg  neuro  Objective:   Vitals:   12/17/22 0942  BP: (!) 143/84  Pulse: 72  SpO2: 98%  Weight: 141 lb (64 kg)   Exam General appearance : Awake, alert, not in any distress. Speech Clear. Not toxic looking HEENT: Atraumatic and Normocephalic, pupils equally reactive to light and accomodation, EOM intact without nystagmus.  B TM with scarring and some bulging; no erythema.  Mucus membranes dry. Turbinates pale and boggy Neck: Supple, no JVD. No cervical lymphadenopathy.  Chest: Good air entry bilaterally, CTAB.  No rales/rhonchi/wheezing CVS: S1 S2 regular, no murmurs.  Extremities: B/L Lower Ext shows no edema, both legs are warm to touch.  B UE and LE full SROM with DTR =intact B.   Neurology: Awake alert, and oriented X 3, CN II-XII intact, Non focal Skin: No Rash  Data Review Lab Results  Component Value Date   HGBA1C 6.0 (H) 06/25/2022   HGBA1C 5.7 (A) 05/13/2021   HGBA1C 6.0 (H) 01/09/2021    Assessment & Plan   1. Essential hypertension Controlled OOO based on home readings - TSH - Basic Metabolic Panel - amLODipine (NORVASC) 5 MG tablet; Take 1 tablet (5 mg total) by mouth daily.  Dispense: 90 tablet; Refill: 0  2. Language barrier, speaks Spanish only AMN interpreters "[place]" used and additional time  performing visit was required.   3. Dizziness Increase water intake.  Believe the dizziness is related to dehydration and eustachian tube dysfunction.  No red flags - TSH - Basic Metabolic Panel  4. Seasonal allergic rhinitis, unspecified trigger  - fluticasone (FLONASE) 50 MCG/ACT nasal spray; insert 1 spray into each nostril(s) twice a week  Dispense: 16 g; Refill: 5  5. Dysfunction of both eustachian tubes - fluticasone (FLONASE) 50 MCG/ACT nasal spray; insert 1 spray into each nostril(s) twice a week  Dispense: 16 g; Refill: 5  6. Arthritis and leg pain - diclofenac (VOLTAREN) 75 MG EC tablet; Take 1 tablet (75 mg total) by mouth 2 (two) times daily as  needed.  Dispense: 60 tablet; Refill: 1  7. Gastroesophageal reflux disease without esophagitis - omeprazole (PRILOSEC) 20 MG capsule; Take 1 capsule (20 mg total) by mouth daily.  Dispense: 30 capsule; Refill: 6  8. Bloating Stopped metformin  9. Prediabetes She quit taking metformin.   I have advised the patient to work at a goal of eliminating sugary drinks, candy, desserts, sweets, refined sugars, processed foods, and white carbohydrates.   - Hemoglobin A1c    Return in about 3 months (around 03/19/2023) for PCP for chronic conditions([place]).  The patient was given clear instructions to go to ER or return to medical center if symptoms don't improve, worsen or new problems develop. The patient verbalized understanding. The patient was told to call to get lab results if they haven't heard anything in the next week.      Georgian Co, PA-C [facility] and [facility] [place], [place] [phone]   12/17/2022, 10:08 AM

## 2022-12-17 NOTE — Progress Notes (Signed)
Veronica Hart # [id number]

## 2022-12-17 NOTE — Patient Instructions (Addendum)
Drink 80 ounces water daily to prevent dizziness.  Mareos Dizziness Los mareos son un problema muy frecuente. Causan sensacin de inestabilidad o de desvanecimiento. Puede sentir que se va a desmayar. Los [employer] pueden provocarle una lesin si se tropieza o se cae. La causa puede deberse a [employer], tales como los siguientes: Medicamentos. No tener suficiente agua en el cuerpo (deshidratacin). Enfermedad. Siga estas instrucciones en su casa: Comida y bebida  Beba suficiente lquido para mantener el pis (orina) de color amarillo plido. Esto evita la deshidratacin. Trate de beber ms lquidos transparentes, como agua. No beba alcohol. Limite la cantidad de cafena que bebe o come si el mdico se lo indica. Limite la cantidad de sal (sodio) que bebe o come si el mdico se lo indica. Actividad  Evite los movimientos rpidos. Cuando se levante de una silla, hgalo con lentitud y sujtese hasta sentirse bien. Por la maana, sintese primero a un lado de la cama. Cuando se sienta bien, pngase lentamente de pie mientras se sostiene de algo. Haga esto hasta que se sienta seguro en cuanto al equilibrio. Mueva las piernas con frecuencia si debe estar de pie en un lugar durante mucho tiempo. Mientras est de pie, contraiga y relaje los msculos de las piernas. No conduzca vehculos ni opere maquinaria si se siente mareado. Evite agacharse si se siente mareado. En su casa, coloque los objetos en algn lugar que le resulte fcil alcanzarlos sin agacharse. Estilo de vida No fume ni consuma ningn producto que contenga nicotina o tabaco. Si necesita ayuda para dejar de fumar, consulte al mdico. Intente bajar el nivel de estrs. Para hacerlo, puede usar mtodos como el yoga o la meditacin. Hable con el mdico si necesita ayuda. Instrucciones generales Controle sus mareos para ver si hay cambios. Use los medicamentos de venta libre y los recetados solamente como se lo haya indicado el mdico.  Hable con el mdico si cree que la causa de sus mareos es algn medicamento que est tomando. Infrmele a un amigo o a un familiar si se siente mareado. Pdale a esta persona que llame al mdico si observa cambios en su comportamiento. Concurra a todas las visitas de seguimiento. Comunquese con un mdico si: Los [employer]. Los [employer] o la sensacin de Production assistant, radio. Tiene ganas de vomitar (nuseas). Tiene problemas para escuchar. Aparecen nuevos sntomas. Siente inestabilidad al estar de pie. Siente que la Biochemist, clinical vueltas. Dolor o rigidez en el cuello. Tiene fiebre. Solicite ayuda de inmediato si: Vomita o tiene heces acuosas (diarrea), y no puede comer o beber nada. Tiene dificultad para hacer lo siguiente: Hablar. Caminar. Tragar. Usar los brazos, las manos o las piernas. Se siente constantemente dbil. No piensa con claridad o tiene dificultad para armar oraciones. Es posible que un amigo o un familiar adviertan que esto ocurre. Tiene los siguientes sntomas: Journalist, newspaper. Dolor en el vientre (abdomen). Falta de aire. Sudoracin. Presenta cambios en la visin. Sangrado. Tiene un dolor de cabeza muy intenso. Estos sntomas pueden Customer service manager. Solicite ayuda de inmediato. Comunquese con el servicio de emergencias de su localidad (911 en los [place]). No espere a ver si los sntomas desaparecen. No conduzca por sus propios medios [employer]. Resumen Los mareos causan sensacin de inestabilidad o de desvanecimiento. Puede sentir que se va a desmayar. Beba suficiente lquido para Radio producer pis (orina) de color amarillo plido. No beba alcohol. Evite los movimientos rpidos si se siente mareado. Controle sus mareos para ver  si hay cambios. Esta informacin no tiene Theme park manager el consejo del mdico. Asegrese de hacerle al mdico cualquier pregunta que tenga. Document Revised: 02/20/2020 Document Reviewed:  02/20/2020 Elsevier Patient Education  2024 [employer].

## 2022-12-18 ENCOUNTER — Other Ambulatory Visit: Payer: Self-pay | Admitting: Physician Assistant

## 2022-12-18 ENCOUNTER — Other Ambulatory Visit: Payer: Self-pay

## 2022-12-18 DIAGNOSIS — R7303 Prediabetes: Secondary | ICD-10-CM

## 2022-12-18 LAB — HEMOGLOBIN A1C
Est. average glucose Bld gHb Est-mCnc: 134 mg/dL
Hgb A1c MFr Bld: 6.3 % — ABNORMAL HIGH (ref 4.8–5.6)

## 2022-12-18 LAB — BASIC METABOLIC PANEL
BUN/Creatinine Ratio: 28 (ref 12–28)
BUN: 19 mg/dL (ref 8–27)
CO2: 23 mmol/L (ref 20–29)
Calcium: 9.3 mg/dL (ref 8.7–10.3)
Chloride: 105 mmol/L (ref 96–106)
Creatinine, Ser: 0.69 mg/dL (ref 0.57–1.00)
Glucose: 91 mg/dL (ref 70–99)
Potassium: 4.6 mmol/L (ref 3.5–5.2)
Sodium: 140 mmol/L (ref 134–144)
eGFR: 96 mL/min/{1.73_m2} (ref >59)

## 2022-12-18 LAB — TSH: TSH: 0.955 u[IU]/mL (ref 0.450–4.500)

## 2022-12-18 MED ORDER — METFORMIN HCL 500 MG PO TABS
500.0000 mg | ORAL_TABLET | Freq: Two times a day (BID) | ORAL | 3 refills | Status: AC
Start: 2022-12-18 — End: ?
  Filled 2022-12-18: qty 180, 90d supply, fill #0
  Filled 2023-03-23: qty 180, 90d supply, fill #1
  Filled 2023-06-29: qty 180, 90d supply, fill #2
  Filled 2023-11-02 (×2): qty 180, 90d supply, fill #3

## 2022-12-19 ENCOUNTER — Telehealth: Payer: Self-pay

## 2022-12-19 NOTE — Telephone Encounter (Signed)
Pt was called and is aware of results, DOB was confirmed.   Interpreter id # Elita Quick [id number]

## 2022-12-19 NOTE — Telephone Encounter (Signed)
-----   Message from Georgian Co sent at 12/18/2022  1:18 PM EDT ----- Work at a goal of eliminating sugary drinks, candy, desserts, sweets, refined sugars, processed foods, and white carbohydrates.  I have also sent a prescription for metformin to take twice daily with food.  Thyroid is normal. Kidneys and electrolytes are normal.  Thanks, Georgian Co, PA-C

## 2022-12-23 ENCOUNTER — Other Ambulatory Visit: Payer: Self-pay

## 2023-03-09 ENCOUNTER — Other Ambulatory Visit: Payer: Self-pay | Admitting: Physician Assistant

## 2023-03-09 ENCOUNTER — Other Ambulatory Visit: Payer: Self-pay

## 2023-03-09 DIAGNOSIS — I10 Essential (primary) hypertension: Secondary | ICD-10-CM

## 2023-03-09 MED ORDER — AMLODIPINE BESYLATE 5 MG PO TABS
5.0000 mg | ORAL_TABLET | Freq: Every day | ORAL | 0 refills | Status: AC
  Filled 2023-03-09: qty 90, 90d supply, fill #0

## 2023-03-23 ENCOUNTER — Other Ambulatory Visit: Payer: Self-pay

## 2023-03-25 ENCOUNTER — Other Ambulatory Visit: Payer: Self-pay

## 2023-05-04 ENCOUNTER — Other Ambulatory Visit: Payer: Self-pay

## 2023-05-04 ENCOUNTER — Other Ambulatory Visit: Payer: Self-pay | Admitting: Internal Medicine

## 2023-05-04 DIAGNOSIS — I10 Essential (primary) hypertension: Secondary | ICD-10-CM

## 2023-05-05 NOTE — Telephone Encounter (Signed)
 Requested Prescriptions  Refused Prescriptions Disp Refills   amLODipine (NORVASC) 5 MG tablet 90 tablet 0    Sig: Take 1 tablet (5 mg total) by mouth daily.     Cardiovascular: Calcium Channel Blockers 2 Failed - 05/05/2023 11:40 AM      Failed - Last BP in normal range    BP Readings from Last 1 Encounters:  12/17/22 (!) 143/84         Passed - Last Heart Rate in normal range    Pulse Readings from Last 1 Encounters:  12/17/22 72         Passed - Valid encounter within last 6 months    Recent Outpatient Visits           4 months ago Essential hypertension   [facility] [place]. [facility] [place], Marylene Land M, [place]   10 months ago Language barrier, speaks Spanish only   [facility]. [facility] [place], [place], [place]   1 year ago Pap smear for cervical cancer screening   [facility] [place] [facility] [place]. [facility] Marcine Matar, MD   1 year ago Essential hypertension   [facility] [place]. [facility] Marcine Matar, MD   2 years ago Essential hypertension   [facility] [place]. [facility] Drucilla Chalet, RPH-CPP

## 2023-05-06 ENCOUNTER — Other Ambulatory Visit: Payer: Self-pay

## 2023-06-29 ENCOUNTER — Other Ambulatory Visit: Payer: Self-pay

## 2023-06-30 ENCOUNTER — Other Ambulatory Visit: Payer: Self-pay

## 2023-07-21 ENCOUNTER — Other Ambulatory Visit (HOSPITAL_COMMUNITY): Payer: Self-pay

## 2023-08-10 ENCOUNTER — Other Ambulatory Visit: Payer: Self-pay

## 2023-08-11 ENCOUNTER — Other Ambulatory Visit: Payer: Self-pay

## 2023-11-02 ENCOUNTER — Other Ambulatory Visit: Payer: Self-pay | Admitting: Pharmacist

## 2023-11-02 ENCOUNTER — Other Ambulatory Visit (HOSPITAL_COMMUNITY): Payer: Self-pay

## 2023-11-02 ENCOUNTER — Other Ambulatory Visit: Payer: Self-pay

## 2023-11-02 DIAGNOSIS — H6993 Unspecified Eustachian tube disorder, bilateral: Secondary | ICD-10-CM

## 2023-11-02 DIAGNOSIS — J302 Other seasonal allergic rhinitis: Secondary | ICD-10-CM

## 2023-11-02 MED ORDER — FLUTICASONE PROPIONATE 50 MCG/ACT NA SUSP
1.0000 | Freq: Every day | NASAL | 5 refills | Status: AC
  Filled 2023-11-02: qty 16, 60d supply, fill #0

## 2024-01-18 ENCOUNTER — Ambulatory Visit: Payer: Self-pay

## 2024-01-18 NOTE — Telephone Encounter (Signed)
 FYI Only or Action Required?: Action required by provider: request for appointment.  Patient was last seen in primary care on 12/17/2022 by Danton Jon HERO, PA-C.  Called Nurse Triage reporting Neck Pain.  Symptoms began today.  Interventions attempted: Prescription medications: Amlodipine .  Symptoms are: gradually improving.  Triage Disposition: See PCP When Office is Open (Within 3 Days)  Patient/caregiver understands and will follow disposition?:  Reason for Disposition  [1] MODERATE neck pain (e.g., interferes with normal activities) AND [2] present > 3 days  Answer Assessment - Initial Assessment Questions Interpreter on the line. Patient stated this happened before a couple months ago. Patient given Amlodipine  5mg  for blood pressure, was feeling sleepy on medication so has not been taking it. Denies checking blood pressure at home daily. Checked BP today because was not feeling well it was 178/75, and took BP medication because it was high. Educated patient on importance of taking BP medications daily as prescribed, not prn. No available appointments in office, please advise. Callback [phone], patient needs interpreter.   1. ONSET: When did the pain begin?      This morning  2. LOCATION: Where does it hurt?      Back of neck on both sides by ears  3. PATTERN Does the pain come and go, or has it been constant since it started?      Constant this morning but has subsided   4. CAUSE: What do you think is causing the neck pain?     Unsure  5. OTHER SYMPTOMS: Do you have any other symptoms? (e.g., headache, fever, chest pain, difficulty breathing, neck swelling)     Dizziness, nausea, but has subsided  Protocols used: Neck Pain or Stiffness-A-AH  Copied from CRM #[id number]. Topic: Clinical - Red Word Triage >> Jan 18, 2024  4:26 PM Nathanel BROCKS wrote: Red Word that prompted transfer to Nurse Triage: head hurting, dizziness, paining going down neck.   Bp 178/69  bp  167/69   ----------------------------------------------------------------------- From previous Reason for Contact - Scheduling: Patient/patient representative is calling to schedule an appointment. Refer to attachments for appointment information.

## 2024-01-19 ENCOUNTER — Ambulatory Visit: Payer: Self-pay

## 2024-01-19 NOTE — Telephone Encounter (Signed)
 Please f/u with pt. Mobile clinic is not available until 11/19.

## 2024-04-04 ENCOUNTER — Telehealth: Payer: Self-pay | Admitting: Internal Medicine

## 2024-04-04 NOTE — Telephone Encounter (Signed)
 Due to inclement weather, our office will be closed tomorrow 04/05/2024 . Well reach out to reschedule your appointment. Thank you.Please call [phone] ( if the pt calls back please resch appt )

## 2024-04-05 ENCOUNTER — Ambulatory Visit: Payer: Self-pay | Admitting: Internal Medicine

## 2024-04-22 ENCOUNTER — Ambulatory Visit: Payer: Self-pay | Admitting: Internal Medicine

## 2024-06-03 ENCOUNTER — Ambulatory Visit: Payer: Self-pay | Admitting: Internal Medicine
